# Patient Record
Sex: Male | Born: 1955 | Race: White | Hispanic: No | Smoking: Current every day smoker
Health system: Southern US, Community
[De-identification: ages and names within clinical notes are randomized; demographics above are authoritative.]

## PROBLEM LIST (undated history)

## (undated) DIAGNOSIS — J209 Acute bronchitis, unspecified: Secondary | ICD-10-CM

## (undated) DIAGNOSIS — R062 Wheezing: Secondary | ICD-10-CM

## (undated) DIAGNOSIS — J45909 Unspecified asthma, uncomplicated: Secondary | ICD-10-CM

## (undated) DIAGNOSIS — F341 Dysthymic disorder: Secondary | ICD-10-CM

## (undated) DIAGNOSIS — J309 Allergic rhinitis, unspecified: Secondary | ICD-10-CM

## (undated) DIAGNOSIS — H1030 Unspecified acute conjunctivitis, unspecified eye: Secondary | ICD-10-CM

## (undated) DIAGNOSIS — M7020 Olecranon bursitis, unspecified elbow: Secondary | ICD-10-CM

## (undated) DIAGNOSIS — F528 Other sexual dysfunction not due to a substance or known physiological condition: Secondary | ICD-10-CM

## (undated) DIAGNOSIS — E785 Hyperlipidemia, unspecified: Secondary | ICD-10-CM

## (undated) DIAGNOSIS — H9190 Unspecified hearing loss, unspecified ear: Secondary | ICD-10-CM

## (undated) DIAGNOSIS — J019 Acute sinusitis, unspecified: Secondary | ICD-10-CM

## (undated) HISTORY — DX: Acute bronchitis, unspecified: J20.9

## (undated) HISTORY — DX: Allergic rhinitis, unspecified: J30.9

## (undated) HISTORY — DX: Hyperlipidemia, unspecified: E78.5

## (undated) HISTORY — DX: Unspecified acute conjunctivitis, unspecified eye: H10.30

## (undated) HISTORY — DX: Unspecified hearing loss, unspecified ear: H91.90

## (undated) HISTORY — PX: WISDOM TOOTH EXTRACTION: SHX21

## (undated) HISTORY — DX: Olecranon bursitis, unspecified elbow: M70.20

## (undated) HISTORY — DX: Wheezing: R06.2

## (undated) HISTORY — DX: Unspecified asthma, uncomplicated: J45.909

## (undated) HISTORY — DX: Dysthymic disorder: F34.1

## (undated) HISTORY — DX: Other sexual dysfunction not due to a substance or known physiological condition: F52.8

## (undated) HISTORY — DX: Acute sinusitis, unspecified: J01.90

---

## 2005-04-24 ENCOUNTER — Ambulatory Visit: Payer: Self-pay | Admitting: Internal Medicine

## 2005-09-04 ENCOUNTER — Ambulatory Visit: Payer: Self-pay | Admitting: Internal Medicine

## 2006-05-05 ENCOUNTER — Ambulatory Visit: Payer: Self-pay | Admitting: Internal Medicine

## 2007-09-17 ENCOUNTER — Encounter: Payer: Self-pay | Admitting: Internal Medicine

## 2007-09-17 ENCOUNTER — Ambulatory Visit: Payer: Self-pay | Admitting: Internal Medicine

## 2007-09-17 DIAGNOSIS — J209 Acute bronchitis, unspecified: Secondary | ICD-10-CM

## 2007-09-17 DIAGNOSIS — F528 Other sexual dysfunction not due to a substance or known physiological condition: Secondary | ICD-10-CM

## 2007-09-17 DIAGNOSIS — J45909 Unspecified asthma, uncomplicated: Secondary | ICD-10-CM

## 2007-09-17 HISTORY — DX: Acute bronchitis, unspecified: J20.9

## 2007-09-17 HISTORY — DX: Unspecified asthma, uncomplicated: J45.909

## 2007-09-17 HISTORY — DX: Other sexual dysfunction not due to a substance or known physiological condition: F52.8

## 2007-09-23 ENCOUNTER — Telehealth (INDEPENDENT_AMBULATORY_CARE_PROVIDER_SITE_OTHER): Payer: Self-pay | Admitting: *Deleted

## 2007-09-24 LAB — CONVERTED CEMR LAB
ALT: 15 U/L
AST: 14 U/L
Albumin: 3.7 g/dL
Alkaline Phosphatase: 70 U/L
BUN: 11 mg/dL
Bacteria, UA: NEGATIVE
Basophils Absolute: 0.4 K/uL — ABNORMAL HIGH
Basophils Relative: 4.9 % — ABNORMAL HIGH
Bilirubin, Direct: 0.2 mg/dL
CO2: 30 meq/L
Calcium: 9.4 mg/dL
Chloride: 104 meq/L
Cholesterol: 182 mg/dL
Creatinine, Ser: 1.2 mg/dL
Crystals: NEGATIVE
Eosinophils Absolute: 0.1 K/uL
Eosinophils Relative: 1.2 %
GFR calc Af Amer: 83 mL/min
GFR calc non Af Amer: 69 mL/min
Glucose, Bld: 95 mg/dL
HCT: 48.2 %
HDL: 31.5 mg/dL — ABNORMAL LOW
Hemoglobin: 17 g/dL
Ketones, ur: 15 mg/dL — AB
LDL Cholesterol: 136 mg/dL — ABNORMAL HIGH
Leukocytes, UA: NEGATIVE
Lymphocytes Relative: 14.6 %
MCHC: 35.3 g/dL
MCV: 83.9 fL
Monocytes Absolute: 0.8 K/uL — ABNORMAL HIGH
Monocytes Relative: 9 %
Neutro Abs: 6.1 K/uL
Neutrophils Relative %: 70.3 %
Nitrite: NEGATIVE
PSA: 1.83 ng/mL
Platelets: 178 K/uL
Potassium: 4.9 meq/L
RBC: 5.74 M/uL
RDW: 13.4 %
Sodium: 137 meq/L
Specific Gravity, Urine: >=1.03
Squamous Epithelial / HPF: NEGATIVE /LPF
TSH: 1.22 u[IU]/mL
Total Bilirubin: 0.9 mg/dL
Total CHOL/HDL Ratio: 5.8
Total Protein, Urine: NEGATIVE mg/dL
Total Protein: 7 g/dL
Triglycerides: 71 mg/dL
Urine Glucose: NEGATIVE mg/dL
Urobilinogen, UA: 0.2
VLDL: 14 mg/dL
WBC: 8.7 10*3/microliter
pH: 5.5

## 2008-08-17 ENCOUNTER — Ambulatory Visit: Payer: Self-pay | Admitting: Internal Medicine

## 2009-02-07 ENCOUNTER — Ambulatory Visit: Payer: Self-pay | Admitting: Internal Medicine

## 2009-02-07 DIAGNOSIS — H9190 Unspecified hearing loss, unspecified ear: Secondary | ICD-10-CM | POA: Insufficient documentation

## 2009-02-07 DIAGNOSIS — J019 Acute sinusitis, unspecified: Secondary | ICD-10-CM | POA: Insufficient documentation

## 2009-02-07 HISTORY — DX: Acute sinusitis, unspecified: J01.90

## 2009-02-07 HISTORY — DX: Unspecified hearing loss, unspecified ear: H91.90

## 2010-01-04 ENCOUNTER — Ambulatory Visit: Payer: Self-pay | Admitting: Internal Medicine

## 2010-01-04 DIAGNOSIS — R062 Wheezing: Secondary | ICD-10-CM | POA: Insufficient documentation

## 2010-01-04 HISTORY — DX: Wheezing: R06.2

## 2010-03-17 ENCOUNTER — Emergency Department (HOSPITAL_COMMUNITY): Admission: EM | Admit: 2010-03-17 | Discharge: 2010-03-17 | Payer: Self-pay | Admitting: Family Medicine

## 2010-05-10 ENCOUNTER — Ambulatory Visit: Payer: Self-pay | Admitting: Internal Medicine

## 2010-05-10 DIAGNOSIS — H1030 Unspecified acute conjunctivitis, unspecified eye: Secondary | ICD-10-CM

## 2010-05-10 DIAGNOSIS — J309 Allergic rhinitis, unspecified: Secondary | ICD-10-CM

## 2010-05-10 HISTORY — DX: Unspecified acute conjunctivitis, unspecified eye: H10.30

## 2010-05-10 HISTORY — DX: Allergic rhinitis, unspecified: J30.9

## 2010-06-27 ENCOUNTER — Encounter: Admission: RE | Admit: 2010-06-27 | Discharge: 2010-06-27 | Payer: Self-pay | Admitting: Orthopedic Surgery

## 2010-08-08 ENCOUNTER — Ambulatory Visit: Payer: Self-pay | Admitting: Internal Medicine

## 2010-08-08 DIAGNOSIS — F341 Dysthymic disorder: Secondary | ICD-10-CM | POA: Insufficient documentation

## 2010-08-08 DIAGNOSIS — M7020 Olecranon bursitis, unspecified elbow: Secondary | ICD-10-CM | POA: Insufficient documentation

## 2010-08-08 DIAGNOSIS — E785 Hyperlipidemia, unspecified: Secondary | ICD-10-CM | POA: Insufficient documentation

## 2010-08-08 DIAGNOSIS — E782 Mixed hyperlipidemia: Secondary | ICD-10-CM | POA: Insufficient documentation

## 2010-08-08 HISTORY — DX: Dysthymic disorder: F34.1

## 2010-08-08 HISTORY — DX: Hyperlipidemia, unspecified: E78.5

## 2010-08-08 HISTORY — DX: Olecranon bursitis, unspecified elbow: M70.20

## 2010-08-08 LAB — CONVERTED CEMR LAB
Alkaline Phosphatase: 75 units/L (ref 39–117)
BUN: 13 mg/dL (ref 6–23)
Basophils Absolute: 0.1 10*3/uL (ref 0.0–0.1)
Basophils Relative: 1.1 % (ref 0.0–3.0)
Calcium: 9.5 mg/dL (ref 8.4–10.5)
GFR calc non Af Amer: 80.69 mL/min (ref >60)
Glucose, Bld: 79 mg/dL (ref 70–99)
HDL: 39.8 mg/dL (ref >39.00)
Hemoglobin: 16.9 g/dL (ref 13.0–17.0)
Monocytes Absolute: 0.4 10*3/uL (ref 0.1–1.0)
Monocytes Relative: 4.6 % (ref 3.0–12.0)
Neutro Abs: 7.1 10*3/uL (ref 1.4–7.7)
Nitrite: NEGATIVE
PSA: 2 ng/mL (ref 0.10–4.00)
Platelets: 229 10*3/uL (ref 150.0–400.0)
RDW: 14.7 % — ABNORMAL HIGH (ref 11.5–14.6)
Sodium: 138 meq/L (ref 135–145)
Specific Gravity, Urine: 1.02 (ref 1.000–1.030)
Total Bilirubin: 0.9 mg/dL (ref 0.3–1.2)
Total CHOL/HDL Ratio: 5
Total Protein, Urine: NEGATIVE mg/dL
Total Protein: 7.1 g/dL (ref 6.0–8.3)
Triglycerides: 67 mg/dL (ref 0.0–149.0)
WBC: 9.4 10*3/uL (ref 4.5–10.5)

## 2010-11-20 ENCOUNTER — Ambulatory Visit
Admission: RE | Admit: 2010-11-20 | Discharge: 2010-11-20 | Payer: Self-pay | Source: Home / Self Care | Attending: Internal Medicine | Admitting: Internal Medicine

## 2010-12-24 NOTE — Assessment & Plan Note (Signed)
Summary: COUGH-EAR PRESSURE W/POPPING-SINUS PROBLEM STC   Vital Signs:  Patient profile:   55 year old male Height:      71 inches (180.34 cm) Weight:      205.8 pounds (93.55 kg) O2 Sat:      97 % on Room air Temp:     98.2 degrees F (36.78 degrees C) oral Pulse rate:   88 / minute BP sitting:   112 / 68  (left arm) Cuff size:   regular  Vitals Entered By: Orlan Leavens (May 10, 2010 4:29 PM)  O2 Flow:  Room air  CC: Cough/ (L) ear pressure Is Patient Diabetic? No Pain Assessment Patient in pain? no        CC:  Cough/ (L) ear pressure.  History of Present Illness: here with acute onset mild to mod x 3 days gradually worsening faicial pain, pressure, fever , greenish d/c with slight ST and nonprod cough;  also today with onset bilat conjunctival d/c , not sure of color, but not assoc wtih vision problem, hearing loss or earache or headache, dizziness;  has had nasal allergy symptoms for several weeks as well with some popping to the left ear.  Pt denies CP, sob, doe, wheezing, orthopnea, pnd, worsening LE edema, palps, dizziness or syncope   Pt denies new neuro symptoms such as headache, facial or extremity weakness   Mucines OTC not helping.    Problems Prior to Update: 1)  Wheezing  (ICD-786.07) 2)  Unspecified Hearing Loss  (ICD-389.9) 3)  Sinusitis- Acute-nos  (ICD-461.9) 4)  Bronchitis, Acute With Bronchospasm  (ICD-466.0) 5)  Preventive Health Care  (ICD-V70.0) 6)  Erectile Dysfunction  (ICD-302.72) 7)  Asthma, Childhood  (ICD-493.00) 8)  Asthmatic Bronchitis, Acute  (ICD-466.0) 9)  Family History Diabetes 1st Degree Relative  (ICD-V18.0)  Medications Prior to Update: 1)  Clarithromycin 500 Mg Tabs (Clarithromycin) .Marland Kitchen.. 1 By Mouth Two Times A Day 2)  Promethazine-Codeine 6.25-10 Mg/29ml Syrp (Promethazine-Codeine) .Marland Kitchen.. 1 Tsp By Mouth Q 6 Hrs As Needed Cough 3)  Cialis 20 Mg Tabs (Tadalafil) .Marland Kitchen.. 1 By Mouth Once Daily As Needed 4)  Prednisone 10 Mg Tabs (Prednisone)  .... 3po Qd For 3days, Then 2po Qd For 3days, Then 1po Qd For 3days, Then Stop  Current Medications (verified): 1)  Promethazine-Codeine 6.25-10 Mg/15ml Syrp (Promethazine-Codeine) .Marland Kitchen.. 1 Tsp By Mouth Q 6 Hrs As Needed Cough 2)  Cialis 20 Mg Tabs (Tadalafil) .Marland Kitchen.. 1 By Mouth Once Daily As Needed 3)  Cephalexin 500 Mg Caps (Cephalexin) .Marland Kitchen.. 1 By Mouth Three Times A Day 4)  Tobramycin Sulfate 0.3 % Soln (Tobramycin Sulfate) .... 2 Gtts Both Eyes Qid For 10 Days  Allergies (verified): No Known Drug Allergies  Past History:  Past Surgical History: Last updated: 09/17/2007 none  Social History: Last updated: 09/17/2007 Current Smoker Alcohol use-yes  Risk Factors: Smoking Status: current (09/17/2007)  Past Medical History: childhood asthma E.D. Allergic rhinitis  Review of Systems       all otherwise negative per pt -    Physical Exam  General:  alert and overweight-appearing. , mild ill   Head:  normocephalic and atraumatic.   Eyes:  vision grossly intact, pupils equal, and pupils round., bilat conjunctival erythema and slight d/c   Ears:  bilat tm's red, sinus tender bialt Nose:  nasal dischargemucosal pallor and mucosal edema.   Mouth:  pharyngeal erythema and fair dentition.   Neck:  supple and cervical lymphadenopathy.   Lungs:  normal respiratory effort and  normal breath sounds.   Heart:  normal rate and regular rhythm.   Extremities:  no edema, no erythema    Impression & Recommendations:  Problem # 1:  SINUSITIS- ACUTE-NOS (ICD-461.9)  The following medications were removed from the medication list:    Clarithromycin 500 Mg Tabs (Clarithromycin) .Marland Kitchen... 1 by mouth two times a day His updated medication list for this problem includes:    Promethazine-codeine 6.25-10 Mg/66ml Syrp (Promethazine-codeine) .Marland Kitchen... 1 tsp by mouth q 6 hrs as needed cough    Cephalexin 500 Mg Caps (Cephalexin) .Marland Kitchen... 1 by mouth three times a day treat as above, f/u any worsening signs or  symptoms   Problem # 2:  CONJUNCTIVITIS, ACUTE, BILATERAL (ICD-372.00)  His updated medication list for this problem includes:    Tobramycin Sulfate 0.3 % Soln (Tobramycin sulfate) .Marland Kitchen... 2 gtts both eyes qid for 10 days treat as above, f/u any worsening signs or symptoms   Problem # 3:  ALLERGIC RHINITIS (ICD-477.9) advised to take OTC allegra  Complete Medication List: 1)  Promethazine-codeine 6.25-10 Mg/59ml Syrp (Promethazine-codeine) .Marland Kitchen.. 1 tsp by mouth q 6 hrs as needed cough 2)  Cialis 20 Mg Tabs (Tadalafil) .Marland Kitchen.. 1 by mouth once daily as needed 3)  Cephalexin 500 Mg Caps (Cephalexin) .Marland Kitchen.. 1 by mouth three times a day 4)  Tobramycin Sulfate 0.3 % Soln (Tobramycin sulfate) .... 2 gtts both eyes qid for 10 days  Patient Instructions: 1)  Please take all new medications as prescribed  2)  Continue all previous medications as before this visit  3)  you can also use allegra as needed  4)  Please schedule a follow-up appointment in 3 months with CPX labs Prescriptions: TOBRAMYCIN SULFATE 0.3 % SOLN (TOBRAMYCIN SULFATE) 2 gtts both eyes qid for 10 days  #1 x 0   Entered and Authorized by:   Corwin Levins MD   Signed by:   Corwin Levins MD on 05/10/2010   Method used:   Print then Give to Patient   RxID:   [id number] CEPHALEXIN 500 MG CAPS (CEPHALEXIN) 1 by mouth three times a day  #30 x 0   Entered and Authorized by:   Corwin Levins MD   Signed by:   Corwin Levins MD on 05/10/2010   Method used:   Print then Give to Patient   RxID:   [id number] PROMETHAZINE-CODEINE 6.25-10 MG/5ML SYRP (PROMETHAZINE-CODEINE) 1 tsp by mouth q 6 hrs as needed cough  #6oz x 1   Entered and Authorized by:   Corwin Levins MD   Signed by:   Corwin Levins MD on 05/10/2010   Method used:   Print then Give to Patient   RxID:   [id number]

## 2010-12-24 NOTE — Assessment & Plan Note (Signed)
Summary: painful elbow/cd   Vital Signs:  Patient profile:   55 year old male Height:      72 inches Weight:      198.50 pounds BMI:     27.02 O2 Sat:      97 % on Room air Temp:     98.6 degrees F oral Pulse rate:   88 / minute BP sitting:   144 / 100  (left arm) Cuff size:   regular  Vitals Entered By: Zella Ball Ewing CMA (AAMA) (August 08, 2010 11:22 AM)  O2 Flow:  Room air  Preventive Care Screening     declines colonoscopy/tetanus at this time  CC: Left elbow swollen, numb, discuss depression/RE   CC:  Left elbow swollen, numb, and discuss depression/RE.  History of Present Illness: here to f/u adn wellness;  has c/o swollen are to the tip of the left elbow for 2 wks, slightly better but persists though little to no pain and was wondering since her read on the internet if it needed to be drained.  Also incidently with several weeks increased social stressors, does not want counseling but wonders also med tx such as SSRI.  Denies suicidal ideaiton, but has had icnreased fatigue, sleeping less, low mood and lack of energy adn anhedonia, also with increased marked anxiety and one recent panic episode, but did not go to ER.   Preventive Screening-Counseling & Management      Drug Use:  no.    Problems Prior to Update: 1)  Olecranon Bursitis, Left  (ICD-726.33) 2)  Hyperlipidemia  (ICD-272.4) 3)  Anxiety Depression  (ICD-300.4) 4)  Allergic Rhinitis  (ICD-477.9) 5)  Conjunctivitis, Acute, Bilateral  (ICD-372.00) 6)  Wheezing  (ICD-786.07) 7)  Unspecified Hearing Loss  (ICD-389.9) 8)  Sinusitis- Acute-nos  (ICD-461.9) 9)  Bronchitis, Acute With Bronchospasm  (ICD-466.0) 10)  Preventive Health Care  (ICD-V70.0) 11)  Erectile Dysfunction  (ICD-302.72) 12)  Asthma, Childhood  (ICD-493.00) 13)  Asthmatic Bronchitis, Acute  (ICD-466.0) 14)  Family History Diabetes 1st Degree Relative  (ICD-V18.0)  Medications Prior to Update: 1)  Promethazine-Codeine 6.25-10 Mg/42ml Syrp  (Promethazine-Codeine) .Marland Kitchen.. 1 Tsp By Mouth Q 6 Hrs As Needed Cough 2)  Cialis 20 Mg Tabs (Tadalafil) .Marland Kitchen.. 1 By Mouth Once Daily As Needed 3)  Cephalexin 500 Mg Caps (Cephalexin) .Marland Kitchen.. 1 By Mouth Three Times A Day 4)  Tobramycin Sulfate 0.3 % Soln (Tobramycin Sulfate) .... 2 Gtts Both Eyes Qid For 10 Days  Current Medications (verified): 1)  Cialis 20 Mg Tabs (Tadalafil) .Marland Kitchen.. 1 By Mouth Once Daily As Needed 2)  Alprazolam 0.5 Mg Tabs (Alprazolam) .Marland Kitchen.. 1by Mouth Two Times A Day As Needed 3)  Citalopram Hydrobromide 20 Mg Tabs (Citalopram Hydrobromide) .Marland Kitchen.. 1po Once Daily 4)  Aspir-Low 81 Mg Tbec (Aspirin) .Marland Kitchen.. 1 By Mouth Once Daily  Allergies (verified): No Known Drug Allergies  Past History:  Past Surgical History: Last updated: 09/17/2007 none  Family History: Last updated: 09/17/2007 Family History Diabetes 1st degree relative Family History High cholesterol Family History Hypertension  Social History: Last updated: 08/08/2010 Current Smoker Alcohol use-yes Drug use-no Married  Risk Factors: Smoking Status: current (09/17/2007)  Past Medical History: childhood asthma E.D. Allergic rhinitis Hyperlipidemia  Social History: Reviewed history from 09/17/2007 and no changes required. Current Smoker Alcohol use-yes Drug use-no Married Drug Use:  no  Review of Systems  The patient denies anorexia, fever, vision loss, decreased hearing, hoarseness, chest pain, syncope, dyspnea on exertion, peripheral edema, prolonged cough, headaches,  hemoptysis, abdominal pain, melena, hematochezia, severe indigestion/heartburn, hematuria, muscle weakness, suspicious skin lesions, transient blindness, difficulty walking, depression, unusual weight change, abnormal bleeding, enlarged lymph nodes, and angioedema.         all otherwise negative per pt -    Physical Exam  General:  alert and overweight-appearing.  Head:  normocephalic and atraumatic.   Eyes:  vision grossly intact,  pupils equal, and pupils round., Ears:  R ear normal and L ear normal.   Nose:  no external deformity and no nasal discharge.   Mouth:  no gingival abnormalities and pharynx pink and moist.   Neck:  supple and no masses.   Lungs:  normal respiratory effort and normal breath sounds.   Heart:  normal rate and regular rhythm.   Abdomen:  soft, non-tender, and normal bowel sounds.   Msk:  no joint tenderness and no joint swelling.except for 1-2+ left elbow bursa sweling with minor tender only, no erythema or drainage   Extremities:  no edema, no erythema  Neurologic:  cranial nerves II-XII intact and strength normal in all extremities.   Skin:  color normal and no rashes.   Psych:  depressed affect and moderately anxious.     Impression & Recommendations:  Problem # 1:  Preventive Health Care (ICD-V70.0)  Overall doing well, age appropriate education and counseling updated and referral for appropriate preventive services done unless declined, immunizations up to date or declined, diet counseling done if overweight, urged to quit smoking if smokes , most recent labs reviewed and current ordered if appropriate, ecg reviewed or declined (interpretation per ECG scanned in the EMR if done); information regarding Medicare Prevention requirements given if appropriate; speciality referrals updated as appropriate   Orders: TLB-BMP (Basic Metabolic Panel-BMET) (80048-METABOL) TLB-CBC Platelet - w/Differential (85025-CBCD) TLB-Hepatic/Liver Function Pnl (80076-HEPATIC) TLB-Lipid Panel (80061-LIPID) TLB-TSH (Thyroid Stimulating Hormone) (84443-TSH) TLB-PSA (Prostate Specific Antigen) (84153-PSA) TLB-Udip ONLY (81003-UDIP)  Problem # 2:  ANXIETY DEPRESSION (ICD-300.4) situational - tx for now with citalopram/alprazolam asd, f/u any worsening symptoms, declines counseling at this time  Problem # 3:  OLECRANON BURSITIS, LEFT (ICD-726.33) mild, d/w pt - no tx needed at this time, should continue to  resolve in the next few wks, to avoid trauma or overuse  Complete Medication List: 1)  Cialis 20 Mg Tabs (Tadalafil) .Marland Kitchen.. 1 by mouth once daily as needed 2)  Alprazolam 0.5 Mg Tabs (Alprazolam) .Marland Kitchen.. 1by mouth two times a day as needed 3)  Citalopram Hydrobromide 20 Mg Tabs (Citalopram hydrobromide) .Marland Kitchen.. 1po once daily 4)  Aspir-low 81 Mg Tbec (Aspirin) .Marland Kitchen.. 1 by mouth once daily  Other Orders: Admin 1st Vaccine (16109) Flu Vaccine 70yrs + [phone])  Patient Instructions: 1)  Please take all new medications as prescribed  - the alprazolam is as needed only, and the citalopram should be started at HALF per day for 3 days to avoid nausea, then one per day after that 2)  Take an Aspirin every day - 81 mg - 1 per day - COATED only 3)  Please go to the Lab in the basement for your blood and/or urine tests today 4)  Please call the number on the [facility] Card for results of your testing  5)  We will also try to mail your results this time 6)  Continue all other previous medications as before this visit  7)  Please schedule a follow-up appointment in 1 year, or as you are able 8)  Please call for refills as you might need  Prescriptions: CITALOPRAM HYDROBROMIDE 20 MG TABS (CITALOPRAM HYDROBROMIDE) 1po once daily  #90 x 3   Entered and Authorized by:   Corwin Levins MD   Signed by:   Corwin Levins MD on 08/08/2010   Method used:   Print then Give to Patient   RxID:   [id number] ALPRAZOLAM 0.5 MG TABS (ALPRAZOLAM) 1by mouth two times a day as needed  #60 x 2   Entered and Authorized by:   Corwin Levins MD   Signed by:   Corwin Levins MD on 08/08/2010   Method used:   Print then Give to Patient   RxID:   [id number]    Flu Vaccine Consent Questions     Do you have a history of severe allergic reactions to this vaccine? no    Any prior history of allergic reactions to egg and/or gelatin? no    Do you have a sensitivity to the preservative Thimersol? no    Do you have a past history of  Guillan-Barre Syndrome? no    Do you currently have an acute febrile illness? no    Have you ever had a severe reaction to latex? no    Vaccine information given and explained to patient? yes    Are you currently pregnant? no    Lot Number:[id number]   Exp Date:05/24/2011   Site Given  Left Deltoid IMflu

## 2010-12-24 NOTE — Assessment & Plan Note (Signed)
Summary: cough fever chills--stc   Vital Signs:  Patient profile:   55 year old male Height:      71 inches Weight:      210 pounds BMI:     29.39 O2 Sat:      97 % on Room air Temp:     99.4 degrees F oral Pulse rate:   87 / minute BP sitting:   122 / 70  (left arm) Cuff size:   large  Vitals Entered ByZella Ball Ewing (January 04, 2010 3:09 PM)  O2 Flow:  Room air CC: cough, fever, chest congestion SOB/RE   CC:  cough, fever, and chest congestion SOB/RE.  History of Present Illness: here with 2 to 3 days onset headache, malaise, general weaknes, low grade temp without chills, mild ST, minimal prod cough but not sure color of sputum, no blood, but also with mild wheezing and hard to take deep breaths.  Pt denies CP, orthopnea, pnd, worsening LE edema, palps, dizziness or syncope .  Pt denies new neuro symptoms such as headache, facial or extremity weakness   Problems Prior to Update: 1)  Wheezing  (ICD-786.07) 2)  Pharyngitis-acute  (ICD-462) 3)  Unspecified Hearing Loss  (ICD-389.9) 4)  Sinusitis- Acute-nos  (ICD-461.9) 5)  Bronchitis, Acute With Bronchospasm  (ICD-466.0) 6)  Preventive Health Care  (ICD-V70.0) 7)  Erectile Dysfunction  (ICD-302.72) 8)  Asthma, Childhood  (ICD-493.00) 9)  Asthmatic Bronchitis, Acute  (ICD-466.0) 10)  Family History Diabetes 1st Degree Relative  (ICD-V18.0)  Medications Prior to Update: 1)  Cephalexin 500 Mg Caps (Cephalexin) .Marland Kitchen.. 1 By Mouth Three Times A Day 2)  Promethazine-Codeine 6.25-10 Mg/60ml Syrp (Promethazine-Codeine) .Marland Kitchen.. 1 Tsp By Mouth Q 6 Hrs As Needed Cough 3)  Cialis 20 Mg Tabs (Tadalafil) .Marland Kitchen.. 1 By Mouth Once Daily As Needed  Current Medications (verified): 1)  Clarithromycin 500 Mg Tabs (Clarithromycin) .Marland Kitchen.. 1 By Mouth Two Times A Day 2)  Promethazine-Codeine 6.25-10 Mg/36ml Syrp (Promethazine-Codeine) .Marland Kitchen.. 1 Tsp By Mouth Q 6 Hrs As Needed Cough 3)  Cialis 20 Mg Tabs (Tadalafil) .Marland Kitchen.. 1 By Mouth Once Daily As Needed 4)   Prednisone 10 Mg Tabs (Prednisone) .... 3po Qd For 3days, Then 2po Qd For 3days, Then 1po Qd For 3days, Then Stop  Allergies (verified): No Known Drug Allergies  Past History:  Past Medical History: Last updated: 09/17/2007 childhood asthma E.D.  Past Surgical History: Last updated: 09/17/2007 none  Social History: Last updated: 09/17/2007 Current Smoker Alcohol use-yes  Risk Factors: Smoking Status: current (09/17/2007)  Review of Systems       all otherwise negative per pt -  Physical Exam  General:  alert and overweight-appearing.  , mild ill  Head:  normocephalic and atraumatic.   Eyes:  vision grossly intact, pupils equal, and pupils round.   Ears:  bilat tm's midl red, sinus nontender Nose:  nasal dischargemucosal pallor and mucosal erythema.   Mouth:  pharyngeal erythema and pharyngeal exudate.   Neck:  supple and cervical lymphadenopathy.   Lungs:  normal respiratory effort, R decreased breath sounds, R wheezes, L decreased breath sounds, and L wheezes.   Heart:  normal rate and regular rhythm.   Extremities:  no edema, no erythema    Impression & Recommendations:  Problem # 1:  PHARYNGITIS-ACUTE (ICD-462)  His updated medication list for this problem includes:    Clarithromycin 500 Mg Tabs (Clarithromycin) .Marland Kitchen... 1 by mouth two times a day treat as above, f/u any worsening signs or  symptoms   Problem # 2:  WHEEZING (ICD-786.07) likely due to above, for prednisone burst adn taper off  Complete Medication List: 1)  Clarithromycin 500 Mg Tabs (Clarithromycin) .Marland Kitchen.. 1 by mouth two times a day 2)  Promethazine-codeine 6.25-10 Mg/36ml Syrp (Promethazine-codeine) .Marland Kitchen.. 1 tsp by mouth q 6 hrs as needed cough 3)  Cialis 20 Mg Tabs (Tadalafil) .Marland Kitchen.. 1 by mouth once daily as needed 4)  Prednisone 10 Mg Tabs (Prednisone) .... 3po qd for 3days, then 2po qd for 3days, then 1po qd for 3days, then stop  Patient Instructions: 1)  Please take all new medications as  prescribed 2)  Continue all previous medications as before this visit  3)  Please schedule a follow-up appointment in 3 months with CPX labs Prescriptions: CIALIS 20 MG TABS (TADALAFIL) 1 by mouth once daily as needed  #5 x 11   Entered and Authorized by:   Corwin Levins MD   Signed by:   Corwin Levins MD on 01/04/2010   Method used:   Print then Give to Patient   RxID:   [id number] PREDNISONE 10 MG TABS (PREDNISONE) 3po qd for 3days, then 2po qd for 3days, then 1po qd for 3days, then stop  #6 x 1   Entered and Authorized by:   Corwin Levins MD   Signed by:   Corwin Levins MD on 01/04/2010   Method used:   Print then Give to Patient   RxID:   [id number] PROMETHAZINE-CODEINE 6.25-10 MG/5ML SYRP (PROMETHAZINE-CODEINE) 1 tsp by mouth q 6 hrs as needed cough  #6 oz x 1   Entered and Authorized by:   Corwin Levins MD   Signed by:   Corwin Levins MD on 01/04/2010   Method used:   Print then Give to Patient   RxID:   [id number] CLARITHROMYCIN 500 MG TABS (CLARITHROMYCIN) 1 by mouth two times a day  #20 x 0   Entered and Authorized by:   Corwin Levins MD   Signed by:   Corwin Levins MD on 01/04/2010   Method used:   Print then Give to Patient   RxID:   [id number]

## 2010-12-26 NOTE — Assessment & Plan Note (Signed)
Summary: CHEST CONGESTION--WHEEZING---STC   Vital Signs:  Patient profile:   55 year old male Height:      72 inches Weight:      203.38 pounds BMI:     27.68 O2 Sat:      93 % on Room air Temp:     99.1 degrees F oral Pulse rate:   92 / minute BP sitting:   130 / 80  (left arm) Cuff size:   large  Vitals Entered By: Zella Ball Ewing CMA [facility] Dull) (November 20, 2010 8:04 AM)  O2 Flow:  Room air CC: Chest Congestion and wheezing/RE   CC:  Chest Congestion and wheezing/RE.  History of Present Illness: here a wk before his inusrance starts with new job with acute visit;  unfort unable so far to quit smoking;  not really trying to follow lower chol diet, but c/o acute onset mild to mod 3 days worsening headache, fever, sinus congestion with colored drainagae, worsening ST, prod cough wtih greenish sputum,  and worsening wheezing with minor sob but denies doe.  Pt denies CP, orthopnea, pnd, worsening LE edema, palps, dizziness or syncope .  Pt denies new neuro symptoms such as facial or extremity weakness .  Pt denies polydipsia, polyuria  Overall good compliance with meds, trying to follow low chol diet, wt stable, little excercise however .  Denies worsening depressive symptoms, suicidal ideation, or panic.  , has stopped his psych meds. No chills  Preventive Screening-Counseling & Management      Drug Use:  no.    Problems Prior to Update: 1)  Wheezing  (ICD-786.07) 2)  Bronchitis-acute  (ICD-466.0) 3)  Olecranon Bursitis, Left  (ICD-726.33) 4)  Hyperlipidemia  (ICD-272.4) 5)  Anxiety Depression  (ICD-300.4) 6)  Allergic Rhinitis  (ICD-477.9) 7)  Conjunctivitis, Acute, Bilateral  (ICD-372.00) 8)  Wheezing  (ICD-786.07) 9)  Unspecified Hearing Loss  (ICD-389.9) 10)  Sinusitis- Acute-nos  (ICD-461.9) 11)  Bronchitis, Acute With Bronchospasm  (ICD-466.0) 12)  Preventive Health Care  (ICD-V70.0) 13)  Erectile Dysfunction  (ICD-302.72) 14)  Asthma, Childhood  (ICD-493.00) 15)  Asthmatic  Bronchitis, Acute  (ICD-466.0) 16)  Family History Diabetes 1st Degree Relative  (ICD-V18.0)  Medications Prior to Update: 1)  Cialis 20 Mg Tabs (Tadalafil) .Marland Kitchen.. 1 By Mouth Once Daily As Needed 2)  Alprazolam 0.5 Mg Tabs (Alprazolam) .Marland Kitchen.. 1by Mouth Two Times A Day As Needed 3)  Citalopram Hydrobromide 20 Mg Tabs (Citalopram Hydrobromide) .Marland Kitchen.. 1po Once Daily 4)  Aspir-Low 81 Mg Tbec (Aspirin) .Marland Kitchen.. 1 By Mouth Once Daily  Current Medications (verified): 1)  Cialis 20 Mg Tabs (Tadalafil) .Marland Kitchen.. 1 By Mouth Once Daily As Needed 2)  Aspir-Low 81 Mg Tbec (Aspirin) .Marland Kitchen.. 1 By Mouth Once Daily 3)  Cephalexin 500 Mg Caps (Cephalexin) .Marland Kitchen.. 1 By Mouth Three Times A Day 4)  Prednisone 10 Mg Tabs (Prednisone) .... 4po Qd For 3days, Then 3po Qd For 3days, Then 2po Qd For 3days, Then 1po Qd For 3 Days, Then Stop 5)  Tessalon Perles 100 Mg Caps (Benzonatate) .Marland Kitchen.. 1po Three Times A Day As Needed  Allergies (verified): No Known Drug Allergies  Past History:  Past Medical History: Last updated: 08/08/2010 childhood asthma E.D. Allergic rhinitis Hyperlipidemia  Past Surgical History: Last updated: 09/17/2007 none  Social History: Last updated: 08/08/2010 Current Smoker Alcohol use-yes Drug use-no Married  Risk Factors: Smoking Status: current (09/17/2007)  Review of Systems       all otherwise negative per pt -  Physical Exam  General:  alert and well-developed.  , miild ill  Head:  normocephalic and atraumatic.   Eyes:  vision grossly intact, pupils equal, and pupils round.   Ears:  R ear normal and L ear normal.   Nose:  nasal dischargemucosal pallor and mucosal edema.   Mouth:  pharyngeal erythema and fair dentition.   Neck:  supple and no masses.   Lungs:  normal respiratory effort, R decreased breath sounds, R wheezes, L decreased breath sounds, and L wheezes.   Heart:  normal rate and regular rhythm.   Extremities:  no edema, no erythema  Psych:  not depressed appearing and  slightly anxious.     Impression & Recommendations:  Problem # 1:  BRONCHITIS-ACUTE (ICD-466.0)  His updated medication list for this problem includes:    Cephalexin 500 Mg Caps (Cephalexin) .Marland Kitchen... 1 by mouth three times a day    Tessalon Perles 100 Mg Caps (Benzonatate) .Marland Kitchen... 1po three times a day as needed treat as above, f/u any worsening signs or symptoms   Problem # 2:  WHEEZING (ICD-786.07)  mild to mod, likely due to above,  for depomedrol Im today, and pred pack for home, suspect underlying copd;  urged pt to quit smoking  Orders: Depo- Medrol 40mg  (J1030) Depo- Medrol 80mg  (J1040) Admin of Therapeutic Inj  intramuscular or subcutaneous (16109)  Problem # 3:  HYPERLIPIDEMIA (ICD-272.4)  Labs Reviewed: SGOT: 16 (08/08/2010)   SGPT: 14 (08/08/2010)   HDL:39.80 (08/08/2010), 31.5 (09/17/2007)  LDL:131 (08/08/2010), 136 (09/17/2007)  Chol:184 (08/08/2010), 182 (09/17/2007)  Trig:67.0 (08/08/2010), 71 (09/17/2007) d/w pt - to cont diet, Pt to continue diet efforts, declines statin or other today- goal LDL less than   Problem # 4:  ANXIETY DEPRESSION (ICD-300.4) stable overall by hx and exam, ok to continue meds/tx as is - currently off meds, no insurance at this time, has new job, declines counseling or further meds  Complete Medication List: 1)  Cialis 20 Mg Tabs (Tadalafil) .Marland Kitchen.. 1 by mouth once daily as needed 2)  Aspir-low 81 Mg Tbec (Aspirin) .Marland Kitchen.. 1 by mouth once daily 3)  Cephalexin 500 Mg Caps (Cephalexin) .Marland Kitchen.. 1 by mouth three times a day 4)  Prednisone 10 Mg Tabs (Prednisone) .... 4po qd for 3days, then 3po qd for 3days, then 2po qd for 3days, then 1po qd for 3 days, then stop 5)  Tessalon Perles 100 Mg Caps (Benzonatate) .Marland Kitchen.. 1po three times a day as needed  Patient Instructions: 1)  You had the steroid shot today 2)  Please take all new medications as prescribed 3)  Continue all previous medications as before this visit  4)  You can also use Mucinex OTC or it's  generic for congestion , and Delsym also for cough 5)  Please stop smoking 6)  Please follow lower cholesterol diet 7)  Please schedule a follow-up appointment as needed. Prescriptions: TESSALON PERLES 100 MG CAPS (BENZONATATE) 1po three times a day as needed  #60 x 1   Entered and Authorized by:   Corwin Levins MD   Signed by:   Corwin Levins MD on 11/20/2010   Method used:   Print then Give to Patient   RxID:   [id number] PREDNISONE 10 MG TABS (PREDNISONE) 4po qd for 3days, then 3po qd for 3days, then 2po qd for 3days, then 1po qd for 3 days, then stop  #30 x 0   Entered and Authorized by:   Corwin Levins MD  Signed by:   Corwin Levins MD on 11/20/2010   Method used:   Print then Give to Patient   RxID:   [id number] CEPHALEXIN 500 MG CAPS (CEPHALEXIN) 1 by mouth three times a day  #30 x 0   Entered and Authorized by:   Corwin Levins MD   Signed by:   Corwin Levins MD on 11/20/2010   Method used:   Print then Give to Patient   RxID:   [id number]    Medication Administration  Injection # 1:    Medication: Depo- Medrol 40mg     Diagnosis: WHEEZING (ICD-786.07)    Route: IM    Site: RUOQ gluteus    Exp Date: 05/2013    Lot #: [id number]    Mfr: [employer]    Comments: patient received 120mg  Depo-Medrol    Patient tolerated injection without complications    Given by: Zella Ball Ewing CMA [facility]) (November 20, 2010 8:43 AM)  Injection # 2:    Medication: Depo- Medrol 80mg     Diagnosis: WHEEZING (ICD-786.07)    Route: IM    Site: RUOQ gluteus    Exp Date: 05/2013    Lot #: [id number]    Mfr: [employer]    Given by: Zella Ball Ewing CMA [facility] Dull) (November 20, 2010 8:43 AM)  Orders Added: 1)  Depo- Medrol 40mg  [J1030] 2)  Depo- Medrol 80mg  [J1040] 3)  Admin of Therapeutic Inj  intramuscular or subcutaneous [96372] 4)  Est. Patient Level IV [59563]

## 2011-03-27 ENCOUNTER — Ambulatory Visit (INDEPENDENT_AMBULATORY_CARE_PROVIDER_SITE_OTHER): Payer: BC Managed Care – PPO | Admitting: Internal Medicine

## 2011-03-27 ENCOUNTER — Encounter: Payer: Self-pay | Admitting: Internal Medicine

## 2011-03-27 VITALS — BP 112/72 | HR 76 | Temp 98.1°F | Ht 72.0 in | Wt 205.2 lb

## 2011-03-27 DIAGNOSIS — Z0000 Encounter for general adult medical examination without abnormal findings: Secondary | ICD-10-CM

## 2011-03-27 DIAGNOSIS — Z131 Encounter for screening for diabetes mellitus: Secondary | ICD-10-CM | POA: Insufficient documentation

## 2011-03-27 DIAGNOSIS — F341 Dysthymic disorder: Secondary | ICD-10-CM

## 2011-03-27 DIAGNOSIS — J209 Acute bronchitis, unspecified: Secondary | ICD-10-CM

## 2011-03-27 DIAGNOSIS — R062 Wheezing: Secondary | ICD-10-CM | POA: Insufficient documentation

## 2011-03-27 MED ORDER — LEVOFLOXACIN 500 MG PO TABS: 500.0000 mg | ORAL_TABLET | Freq: Every day | ORAL | Status: AC

## 2011-03-27 MED ORDER — PREDNISONE 10 MG PO TABS: 10.0000 mg | ORAL_TABLET | Freq: Every day | ORAL | Status: AC

## 2011-03-27 MED ORDER — HYDROCODONE-HOMATROPINE 5-1.5 MG/5ML PO SYRP: 5.0000 mL | ORAL_SOLUTION | Freq: Four times a day (QID) | ORAL | Status: AC | PRN

## 2011-03-27 MED ORDER — METHYLPREDNISOLONE ACETATE 80 MG/ML IJ SUSP
120.0000 mg | Freq: Once | INTRAMUSCULAR | Status: AC
  Administered 2011-03-27: 120 mg via INTRAMUSCULAR

## 2011-03-27 NOTE — Assessment & Plan Note (Signed)
stable overall by hx and exam, most recent lab reviewed with pt, and pt to continue medical treatment as before,  to f/u any worsening symptoms or concerns,  Declines tx such as ssri or counseling

## 2011-03-27 NOTE — Progress Notes (Signed)
  Subjective:    Patient ID: Leslie Harrington, male    DOB: [birth year], 55 y.o.   MRN: [id number]  HPI Here with acute onset mild to mod 2-3 days ST, HA, general weakness and malaise, with prod cough greenish sputum, but Pt denies chest pain,  orthopnea, PND, increased LE swelling, palpitations, dizziness or syncopem but has had mild wheezing, sob and doem better with leftover albuterol inhaler at home , still works well, does not need refill.  Pt denies new neurological symptoms such as new headache, or facial or extremity weakness or numbness   Pt denies polydipsia, polyuria,  Pt states overall good compliance with meds.  Denies worsening depressive symptoms, suicidal ideation, or panic, though has ongoing anxiety, not increased recently. Past Medical History  Diagnosis Date  . HYPERLIPIDEMIA 08/08/2010  . ANXIETY DEPRESSION 08/08/2010  . ERECTILE DYSFUNCTION 09/17/2007  . CONJUNCTIVITIS, ACUTE, BILATERAL 05/10/2010  . Unspecified hearing loss 02/07/2009  . SINUSITIS- ACUTE-NOS 02/07/2009  . Acute bronchitis 09/17/2007  . ALLERGIC RHINITIS 05/10/2010  . ASTHMA, CHILDHOOD 09/17/2007  . OLECRANON BURSITIS, LEFT 08/08/2010  . Wheezing 01/04/2010   No past surgical history on file.  reports that he has been smoking.  He does not have any smokeless tobacco history on file. He reports that he drinks alcohol. He reports that he does not use illicit drugs. family history includes Diabetes in his other; Hyperlipidemia in his other; and Hypertension in his other. No Known Allergies No current outpatient prescriptions on file prior to visit.   No current facility-administered medications on file prior to visit.   Review of Systems All otherwise neg per pt     Objective:   Physical Exam BP 112/72  Pulse 76  Temp(Src) 98.1 F (36.7 C) (Oral)  Ht 6' (1.829 m)  Wt 205 lb 4 oz (93.101 kg)  BMI 27.84 kg/m2  SpO2 97% Physical Exam  VS noted, mild ill Constitutional: Pt appears well-developed and  well-nourished.  HENT: Head: Normocephalic.  Right Ear: External ear normal.  Left Ear: External ear normal.  Eyes: Conjunctivae and EOM are normal. Pupils are equal, round, and reactive to light.  Neck: Normal range of motion. Neck supple.  Cardiovascular: Normal rate and regular rhythm.   Pulmonary/Chest: Effort normal and breath sounds decreased with bilat wheezing Abd:  Soft, NT, non-distended, + BS Neurological: Pt is alert. No cranial nerve deficit.  Skin: Skin is warm. No erythema.  Psychiatric: Pt behavior is normal. Thought content normal. 1+ nervous        Assessment & Plan:

## 2011-03-27 NOTE — Assessment & Plan Note (Signed)
Mild to mod, for depomedrol IM today, and predpack for home,  to f/u any worsening symptoms or concerns

## 2011-03-27 NOTE — Assessment & Plan Note (Signed)
Mild to mod, for antibx course, and cough med,  to f/u any worsening symptoms or concerns

## 2011-03-27 NOTE — Patient Instructions (Signed)
You had the steroid shot today Take all new medications as prescribed Continue all other medications as before Please return in 6 mo with Lab testing done 3-5 days before

## 2011-04-18 ENCOUNTER — Ambulatory Visit (INDEPENDENT_AMBULATORY_CARE_PROVIDER_SITE_OTHER): Payer: BC Managed Care – PPO | Admitting: Internal Medicine

## 2011-04-18 ENCOUNTER — Encounter: Payer: Self-pay | Admitting: Internal Medicine

## 2011-04-18 VITALS — BP 112/70 | HR 78 | Temp 97.8°F | Ht 72.0 in | Wt 205.0 lb

## 2011-04-18 DIAGNOSIS — F341 Dysthymic disorder: Secondary | ICD-10-CM

## 2011-04-18 DIAGNOSIS — J309 Allergic rhinitis, unspecified: Secondary | ICD-10-CM

## 2011-04-18 DIAGNOSIS — J029 Acute pharyngitis, unspecified: Secondary | ICD-10-CM

## 2011-04-18 MED ORDER — AZITHROMYCIN 250 MG PO TABS: ORAL_TABLET | ORAL | Status: AC

## 2011-04-18 NOTE — Patient Instructions (Signed)
Take all new medications as prescribed Continue all other medications as before  

## 2011-04-21 ENCOUNTER — Encounter: Payer: Self-pay | Admitting: Internal Medicine

## 2011-04-21 NOTE — Assessment & Plan Note (Signed)
Mild to mod, for antibx course,  to f/u any worsening symptoms or concerns 

## 2011-04-21 NOTE — Assessment & Plan Note (Signed)
stable overall by hx and exam, most recent lab reviewed with pt, and pt to continue medical treatment as before  Lab Results  Component Value Date   WBC 9.4 08/08/2010   HGB 16.9 08/08/2010   HCT 48.3 08/08/2010   PLT 229.0 08/08/2010   CHOL 184 08/08/2010   TRIG 67.0 08/08/2010   HDL 39.80 08/08/2010   ALT 14 08/08/2010   AST 16 08/08/2010   NA 138 08/08/2010   K 4.7 08/08/2010   CL 102 08/08/2010   CREATININE 1.0 08/08/2010   BUN 13 08/08/2010   CO2 28 08/08/2010   TSH 0.95 08/08/2010   PSA 2.00 08/08/2010

## 2011-04-21 NOTE — Assessment & Plan Note (Signed)
Mild, for OTC allegra prn,  to f/u any worsening symptoms or concerns  

## 2011-04-21 NOTE — Progress Notes (Signed)
  Subjective:    Patient ID: Leslie Harrington, male    DOB: 29-May-1956, 55 y.o.   MRN: [id number]  HPI  Here with 3 days graduyally worsening severe ST, with fever, HA, general weakness and malaise, with slight nonprod cough, but no [place], and Pt denies chest pain, increased sob or doe, wheezing, orthopnea, PND, increased LE swelling, palpitations, dizziness or syncope.  Pt denies new neurological symptoms such as new headache, or facial or extremity weakness or numbness   Pt denies polydipsia, polyuria  Pt states overall good compliance with meds and diet, wt overall stable but little exercise however.    Denies worsening depressive symptoms, suicidal ideation, or panic, though has ongoing anxiety, not increased recently.  Past Medical History  Diagnosis Date  . HYPERLIPIDEMIA 08/08/2010  . ANXIETY DEPRESSION 08/08/2010  . ERECTILE DYSFUNCTION 09/17/2007  . CONJUNCTIVITIS, ACUTE, BILATERAL 05/10/2010  . Unspecified hearing loss 02/07/2009  . SINUSITIS- ACUTE-NOS 02/07/2009  . Acute bronchitis 09/17/2007  . ALLERGIC RHINITIS 05/10/2010  . ASTHMA, CHILDHOOD 09/17/2007  . OLECRANON BURSITIS, LEFT 08/08/2010  . Wheezing 01/04/2010   No past surgical history on file.  reports that he has been smoking.  He does not have any smokeless tobacco history on file. He reports that he drinks alcohol. He reports that he does not use illicit drugs. family history includes Diabetes in his other; Hyperlipidemia in his other; and Hypertension in his other. No Known Allergies Current Outpatient Prescriptions on File Prior to Visit  Medication Sig Dispense Refill  . aspirin 81 MG EC tablet Take 81 mg by mouth daily.          Review of Systems All otherwise neg per pt     Objective:   Physical Exam BP 112/70  Pulse 78  Temp(Src) 97.8 F (36.6 C) (Oral)  Ht 6' (1.829 m)  Wt 205 lb (92.987 kg)  BMI 27.80 kg/m2  SpO2 97% Physical Exam  VS noted Constitutional: Pt appears well-developed and well-nourished.   HENT: Head: Normocephalic.  Right Ear: External ear normal.  Left Ear: External ear normal.  Bilat tm's mild erythema.  Sinus nontender.  Pharynx severe erythema with exudate Eyes: Conjunctivae and EOM are normal. Pupils are equal, round, and reactive to light.  Neck: Normal range of motion. Neck supple. but with bilat submandibular LA Cardiovascular: Normal rate and regular rhythm.   Pulmonary/Chest: Effort normal and breath sounds normal.  Neurological: Pt is alert. No cranial nerve deficit.  Skin: Skin is warm. No erythema.  Psychiatric: Pt behavior is normal. Thought content normal. mild dysphoric, 1+ anxious        Assessment & Plan:

## 2011-12-05 ENCOUNTER — Ambulatory Visit (INDEPENDENT_AMBULATORY_CARE_PROVIDER_SITE_OTHER)
Admission: RE | Admit: 2011-12-05 | Discharge: 2011-12-05 | Disposition: A | Discharge status: Home / Self Care | Payer: BC Managed Care – PPO | Source: Ambulatory Visit | Attending: Internal Medicine | Admitting: Internal Medicine

## 2011-12-05 ENCOUNTER — Encounter: Payer: Self-pay | Admitting: Internal Medicine

## 2011-12-05 ENCOUNTER — Ambulatory Visit (INDEPENDENT_AMBULATORY_CARE_PROVIDER_SITE_OTHER): Payer: BC Managed Care – PPO | Admitting: Internal Medicine

## 2011-12-05 VITALS — BP 138/80 | HR 80 | Temp 99.4°F | Resp 16 | Wt 213.0 lb

## 2011-12-05 DIAGNOSIS — J988 Other specified respiratory disorders: Secondary | ICD-10-CM

## 2011-12-05 DIAGNOSIS — R05 Cough: Secondary | ICD-10-CM

## 2011-12-05 DIAGNOSIS — R059 Cough, unspecified: Secondary | ICD-10-CM

## 2011-12-05 DIAGNOSIS — B9789 Other viral agents as the cause of diseases classified elsewhere: Secondary | ICD-10-CM

## 2011-12-05 DIAGNOSIS — J209 Acute bronchitis, unspecified: Secondary | ICD-10-CM

## 2011-12-05 DIAGNOSIS — Z23 Encounter for immunization: Secondary | ICD-10-CM

## 2011-12-05 MED ORDER — AZITHROMYCIN 500 MG PO TABS: 500.0000 mg | ORAL_TABLET | Freq: Every day | ORAL | Status: AC

## 2011-12-05 MED ORDER — HYDROCODONE-HOMATROPINE 5-1.5 MG/5ML PO SYRP: 5.0000 mL | ORAL_SOLUTION | Freq: Three times a day (TID) | ORAL | Status: AC | PRN

## 2011-12-05 NOTE — Patient Instructions (Signed)

## 2011-12-05 NOTE — Assessment & Plan Note (Signed)
Symptomatic relief

## 2011-12-05 NOTE — Assessment & Plan Note (Signed)
I will check a cxr for pna, mass, edema 

## 2011-12-05 NOTE — Assessment & Plan Note (Signed)
zpak for the infection

## 2011-12-05 NOTE — Progress Notes (Signed)
  Subjective:    Patient ID: Leslie Harrington, male    DOB: 04/13/1956, 56 y.o.   MRN: [id number]  Cough This is a new problem. The current episode started in the past 7 days. The problem has been gradually worsening. The problem occurs every few hours. The cough is productive of purulent sputum. Associated symptoms include chills, a fever, a sore throat, shortness of breath and sweats. Pertinent negatives include no chest pain, ear congestion, ear pain, headaches, heartburn, hemoptysis, myalgias, nasal congestion, postnasal drip, rash, rhinorrhea, weight loss or wheezing. The symptoms are aggravated by nothing. Risk factors for lung disease include smoking/tobacco exposure. He has tried OTC cough suppressant for the symptoms. The treatment provided mild relief. His past medical history is significant for pneumonia.      Review of Systems  Constitutional: Positive for fever and chills. Negative for weight loss, diaphoresis, activity change, appetite change, fatigue and unexpected weight change.  HENT: Positive for sore throat. Negative for ear pain, rhinorrhea and postnasal drip.   Eyes: Negative.   Respiratory: Positive for cough and shortness of breath. Negative for hemoptysis, chest tightness, wheezing and stridor.   Cardiovascular: Negative for chest pain, palpitations and leg swelling.  Gastrointestinal: Negative for heartburn, nausea, vomiting, abdominal pain, diarrhea, constipation and abdominal distention.  Genitourinary: Negative.   Musculoskeletal: Negative for myalgias, back pain, joint swelling, arthralgias and gait problem.  Skin: Negative for color change, pallor, rash and wound.  Neurological: Negative for dizziness, tremors, seizures, syncope, facial asymmetry, speech difficulty, weakness, light-headedness, numbness and headaches.  Hematological: Negative for adenopathy. Does not bruise/bleed easily.  Psychiatric/Behavioral: Negative.        Objective:   Physical Exam    Vitals reviewed. Constitutional: He is oriented to person, place, and time. He appears well-developed and well-nourished. No distress.  HENT:  Head: Normocephalic and atraumatic. No trismus in the jaw.  Right Ear: Hearing, tympanic membrane, external ear and ear canal normal.  Left Ear: Hearing, tympanic membrane, external ear and ear canal normal.  Mouth/Throat: Mucous membranes are normal. Mucous membranes are not pale, not dry and not cyanotic. No uvula swelling. Posterior oropharyngeal erythema present. No oropharyngeal exudate, posterior oropharyngeal edema or tonsillar abscesses.  Eyes: Conjunctivae are normal. Right eye exhibits no discharge. Left eye exhibits no discharge. No scleral icterus.  Neck: Normal range of motion. Neck supple. No JVD present. No tracheal deviation present. No thyromegaly present.  Cardiovascular: Normal rate, regular rhythm, normal heart sounds and intact distal pulses.  Exam reveals no gallop and no friction rub.   No murmur heard. Pulmonary/Chest: Effort normal and breath sounds normal. No stridor. No respiratory distress. He has no wheezes. He has no rales. He exhibits no tenderness.  Abdominal: Soft. Bowel sounds are normal. He exhibits no distension and no mass. There is no tenderness. There is no rebound and no guarding.  Musculoskeletal: Normal range of motion. He exhibits no edema and no tenderness.  Lymphadenopathy:    He has no cervical adenopathy.  Neurological: He is oriented to person, place, and time.  Skin: Skin is warm and dry. No rash noted. He is not diaphoretic. No erythema. No pallor.  Psychiatric: He has a normal mood and affect. His behavior is normal. Judgment and thought content normal.          Assessment & Plan:

## 2011-12-25 ENCOUNTER — Other Ambulatory Visit (INDEPENDENT_AMBULATORY_CARE_PROVIDER_SITE_OTHER): Payer: BC Managed Care – PPO

## 2011-12-25 ENCOUNTER — Ambulatory Visit (INDEPENDENT_AMBULATORY_CARE_PROVIDER_SITE_OTHER): Payer: BC Managed Care – PPO | Admitting: Internal Medicine

## 2011-12-25 ENCOUNTER — Encounter: Payer: Self-pay | Admitting: Internal Medicine

## 2011-12-25 VITALS — BP 146/82 | HR 86 | Temp 97.6°F | Resp 16

## 2011-12-25 DIAGNOSIS — M79609 Pain in unspecified limb: Secondary | ICD-10-CM

## 2011-12-25 DIAGNOSIS — R609 Edema, unspecified: Secondary | ICD-10-CM

## 2011-12-25 DIAGNOSIS — R600 Localized edema: Secondary | ICD-10-CM

## 2011-12-25 DIAGNOSIS — I803 Phlebitis and thrombophlebitis of lower extremities, unspecified: Secondary | ICD-10-CM

## 2011-12-25 DIAGNOSIS — M79605 Pain in left leg: Secondary | ICD-10-CM

## 2011-12-25 LAB — COMPREHENSIVE METABOLIC PANEL
ALT: 12 U/L (ref 0–53)
AST: 15 U/L (ref 0–37)
CO2: 27 mEq/L (ref 19–32)
Calcium: 9.3 mg/dL (ref 8.4–10.5)
Chloride: 107 mEq/L (ref 96–112)
Creatinine, Ser: 0.8 mg/dL (ref 0.4–1.5)
GFR: 109.01 mL/min (ref >60.00)
Sodium: 140 mEq/L (ref 135–145)
Total Protein: 6.9 g/dL (ref 6.0–8.3)

## 2011-12-25 LAB — CBC WITH DIFFERENTIAL/PLATELET
Basophils Absolute: 0.1 10*3/uL (ref 0.0–0.1)
Eosinophils Absolute: 0.1 10*3/uL (ref 0.0–0.7)
Hemoglobin: 14.3 g/dL (ref 13.0–17.0)
Lymphocytes Relative: 26.3 % (ref 12.0–46.0)
MCHC: 34.1 g/dL (ref 30.0–36.0)
Monocytes Relative: 5.3 % (ref 3.0–12.0)
Neutrophils Relative %: 66.5 % (ref 43.0–77.0)
Platelets: 276 10*3/uL (ref 150.0–400.0)
RDW: 14.4 % (ref 11.5–14.6)

## 2011-12-25 LAB — BRAIN NATRIURETIC PEPTIDE: Pro B Natriuretic peptide (BNP): 82 pg/mL (ref 0.0–100.0)

## 2011-12-25 LAB — URINALYSIS, ROUTINE W REFLEX MICROSCOPIC
Specific Gravity, Urine: >=1.03 (ref 1.000–1.030)
Total Protein, Urine: NEGATIVE
Urine Glucose: NEGATIVE
Urobilinogen, UA: 0.2 (ref 0.0–1.0)

## 2011-12-25 NOTE — Patient Instructions (Signed)
Edema Edema is an abnormal build-up of fluids in tissues. Because this is partly dependent on gravity (water flows to the lowest place), it is more common in the leg sand thighs (lower extremities). It is also common in the looser tissues, like around the eyes. Painless swelling of the feet and ankles is common and increases as a person ages. It may affect both legs and may include the calves or even thighs. When squeezed, the fluid may move out of the affected area and may leave a dent for a few moments. CAUSES   Prolonged standing or sitting in one place for extended periods of time. Movement helps pump tissue fluid into the veins, and absence of movement prevents this, resulting in edema.   Varicose veins. The valves in the veins do not work as well as they should. This causes fluid to leak into the tissues.   Fluid and salt overload.   Injury, burn, or surgery to the leg, ankle, or foot, may damage veins and allow fluid to leak out.   Sunburn damages vessels. Leaky vessels allow fluid to go out into the sunburned tissues.   Allergies (from insect bites or stings, medications or chemicals) cause swelling by allowing vessels to become leaky.   Protein in the blood helps keep fluid in your vessels. Low protein, as in malnutrition, allows fluid to leak out.   Hormonal changes, including pregnancy and menstruation, cause fluid retention. This fluid may leak out of vessels and cause edema.   Medications that cause fluid retention. Examples are sex hormones, blood pressure medications, steroid treatment, or anti-depressants.   Some illnesses cause edema, especially heart failure, kidney disease, or liver disease.   Surgery that cuts veins or lymph nodes, such as surgery done for the heart or for breast cancer, may result in edema.  DIAGNOSIS  Your caregiver is usually easily able to determine what is causing your swelling (edema) by simply asking what is wrong (getting a history) and examining  you (doing a physical). Sometimes x-rays, EKG (electrocardiogram or heart tracing), and blood work may be done to evaluate for underlying medical illness. TREATMENT  General treatment includes:  Leg elevation (or elevation of the affected body part).   Restriction of fluid intake.   Prevention of fluid overload.   Compression of the affected body part. Compression with elastic bandages or support stockings squeezes the tissues, preventing fluid from entering and forcing it back into the blood vessels.   Diuretics (also called water pills or fluid pills) pull fluid out of your body in the form of increased urination. These are effective in reducing the swelling, but can have side effects and must be used only under your caregiver's supervision. Diuretics are appropriate only for some types of edema.  The specific treatment can be directed at any underlying causes discovered. Heart, liver, or kidney disease should be treated appropriately. HOME CARE INSTRUCTIONS   Elevate the legs (or affected body part) above the level of the heart, while lying down.   Avoid sitting or standing still for prolonged periods of time.   Avoid putting anything directly under the knees when lying down, and do not wear constricting clothing or garters on the upper legs.   Exercising the legs causes the fluid to work back into the veins and lymphatic channels. This may help the swelling go down.   The pressure applied by elastic bandages or support stockings can help reduce ankle swelling.   A low-salt diet may help reduce fluid   retention and decrease the ankle swelling.   Take any medications exactly as prescribed.  SEEK MEDICAL CARE IF:  Your edema is not responding to recommended treatments. SEEK IMMEDIATE MEDICAL CARE IF:   You develop shortness of breath or chest pain.   You cannot breathe when you lay down; or if, while lying down, you have to get up and go to the window to get your breath.   You  are having increasing swelling without relief from treatment.   You develop a fever over 102 F (38.9 C).   You develop pain or redness in the areas that are swollen.   Tell your caregiver right away if you have gained 3 lb/1.4 kg in 1 day or 5 lb/2.3 kg in a week.  MAKE SURE YOU:   Understand these instructions.   Will watch your condition.   Will get help right away if you are not doing well or get worse.  Document Released: 11/10/2005 Document Revised: 07/23/2011 Document Reviewed: 06/28/2008 [employer] Patient Information 2012 [employer]. 

## 2011-12-25 NOTE — Progress Notes (Signed)
  Subjective:    Patient ID: Leslie Harrington, male    DOB: Jul 12, 1956, 56 y.o.   MRN: [id number]  HPI He returns c/o pain that occurs over his left upper/medial calf that has been present for about one week. He has not had any injury or trauma. He can feel a varicose vein in the area. He has been taking mortin and that helps with the pain.   Review of Systems  Constitutional: Negative for fever, chills, diaphoresis, activity change, appetite change, fatigue and unexpected weight change.  HENT: Negative.   Eyes: Negative.   Respiratory: Negative for cough, choking, shortness of breath, wheezing and stridor.   Cardiovascular: Positive for leg swelling. Negative for chest pain and palpitations.  Gastrointestinal: Negative.   Genitourinary: Negative.   Musculoskeletal: Negative.  Negative for myalgias, back pain, joint swelling, arthralgias and gait problem.  Skin: Negative for color change, pallor, rash and wound.  Neurological: Negative.   Hematological: Negative.   Psychiatric/Behavioral: Negative.  Negative for agitation.       Objective:   Physical Exam  Vitals reviewed. Constitutional: He is oriented to person, place, and time. He appears well-developed and well-nourished. No distress.  HENT:  Head: Normocephalic and atraumatic.  Mouth/Throat: Oropharynx is clear and moist. No oropharyngeal exudate.  Eyes: Conjunctivae are normal. Right eye exhibits no discharge. Left eye exhibits no discharge. No scleral icterus.  Neck: Normal range of motion. Neck supple. No JVD present. No tracheal deviation present. No thyromegaly present.  Cardiovascular: Normal rate, regular rhythm, normal heart sounds and intact distal pulses.  Exam reveals no gallop and no friction rub.   No murmur heard. Pulmonary/Chest: Effort normal and breath sounds normal. No stridor. No respiratory distress. He has no wheezes. He has no rales. He exhibits no tenderness.  Abdominal: Soft. Bowel sounds are normal. He  exhibits no distension and no mass. There is no tenderness. There is no rebound and no guarding.  Musculoskeletal:       Left lower leg: He exhibits tenderness, swelling and deformity. He exhibits no bony tenderness, no edema and no laceration.       Over the left medial/upper calf there is a large palpable but superficial varicose vein that is uncomplicated. The adjacent knee is normal and there is no pain or swelling in the popliteal fossa. The remainder of there left calk is normal with no pain or swelling.  Lymphadenopathy:    He has no cervical adenopathy.  Neurological: He is oriented to person, place, and time.  Skin: Skin is warm and dry. No rash noted. He is not diaphoretic. No erythema. No pallor.  Psychiatric: He has a normal mood and affect. His behavior is normal. Judgment and thought content normal.      Lab Results  Component Value Date   WBC 9.4 08/08/2010   HGB 16.9 08/08/2010   HCT 48.3 08/08/2010   PLT 229.0 08/08/2010   GLUCOSE 79 08/08/2010   CHOL 184 08/08/2010   TRIG 67.0 08/08/2010   HDL 39.80 08/08/2010   LDLCALC 131* 08/08/2010   ALT 14 08/08/2010   AST 16 08/08/2010   NA 138 08/08/2010   K 4.7 08/08/2010   CL 102 08/08/2010   CREATININE 1.0 08/08/2010   BUN 13 08/08/2010   CO2 28 08/08/2010   TSH 0.95 08/08/2010   PSA 2.00 08/08/2010      Assessment & Plan:

## 2011-12-26 ENCOUNTER — Encounter (INDEPENDENT_AMBULATORY_CARE_PROVIDER_SITE_OTHER): Payer: BC Managed Care – PPO | Admitting: *Deleted

## 2011-12-26 ENCOUNTER — Telehealth: Payer: Self-pay

## 2011-12-26 ENCOUNTER — Encounter: Payer: Self-pay | Admitting: Internal Medicine

## 2011-12-26 DIAGNOSIS — M79605 Pain in left leg: Secondary | ICD-10-CM

## 2011-12-26 DIAGNOSIS — R600 Localized edema: Secondary | ICD-10-CM

## 2011-12-26 DIAGNOSIS — I803 Phlebitis and thrombophlebitis of lower extremities, unspecified: Secondary | ICD-10-CM

## 2011-12-26 DIAGNOSIS — M7989 Other specified soft tissue disorders: Secondary | ICD-10-CM

## 2011-12-26 DIAGNOSIS — M79609 Pain in unspecified limb: Secondary | ICD-10-CM

## 2011-12-26 LAB — D-DIMER, QUANTITATIVE: D-Dimer, Quant: 0.75 ug/mL-FEU — ABNORMAL HIGH (ref 0.00–0.48)

## 2011-12-26 NOTE — Telephone Encounter (Signed)
Patient notified per MD.

## 2011-12-26 NOTE — Telephone Encounter (Signed)
Stay on motrin over the weekend, keep it elevated, apply warm compress, if no better next week will ask a vein doctor to see him

## 2011-12-26 NOTE — Telephone Encounter (Signed)
Harriet from [facility] called to inform MD that pt is negative for DVT. Patient would like to know from MD what's next as far as plan of care. Thanks

## 2011-12-28 ENCOUNTER — Encounter: Payer: Self-pay | Admitting: Internal Medicine

## 2011-12-28 NOTE — Assessment & Plan Note (Signed)
I will check for deep involvement, he will rest/elevate/apply warm compress and continue taking motrin as needed

## 2011-12-28 NOTE — Assessment & Plan Note (Signed)
I will check labs today to look for DVT, cellulitis, vasculitis, etc, also I have asked him to get an u/s done to see if there is a deep venous thrombosis

## 2011-12-28 NOTE — Assessment & Plan Note (Signed)
As above.

## 2013-04-26 ENCOUNTER — Emergency Department (HOSPITAL_COMMUNITY): Payer: BC Managed Care – PPO

## 2013-04-26 ENCOUNTER — Telehealth: Payer: Self-pay | Admitting: Internal Medicine

## 2013-04-26 ENCOUNTER — Ambulatory Visit (INDEPENDENT_AMBULATORY_CARE_PROVIDER_SITE_OTHER): Payer: BC Managed Care – PPO | Admitting: Emergency Medicine

## 2013-04-26 ENCOUNTER — Encounter (HOSPITAL_COMMUNITY): Payer: Self-pay | Admitting: *Deleted

## 2013-04-26 ENCOUNTER — Emergency Department (HOSPITAL_COMMUNITY)
Admission: EM | Admit: 2013-04-26 | Discharge: 2013-04-26 | Disposition: A | Discharge status: Home / Self Care | Payer: BC Managed Care – PPO | Attending: Emergency Medicine | Admitting: Emergency Medicine

## 2013-04-26 VITALS — BP 150/86 | HR 79 | Temp 98.1°F | Resp 16 | Ht 70.0 in | Wt 201.2 lb

## 2013-04-26 DIAGNOSIS — J45909 Unspecified asthma, uncomplicated: Secondary | ICD-10-CM | POA: Insufficient documentation

## 2013-04-26 DIAGNOSIS — Z8709 Personal history of other diseases of the respiratory system: Secondary | ICD-10-CM | POA: Insufficient documentation

## 2013-04-26 DIAGNOSIS — R079 Chest pain, unspecified: Secondary | ICD-10-CM

## 2013-04-26 DIAGNOSIS — H9190 Unspecified hearing loss, unspecified ear: Secondary | ICD-10-CM | POA: Insufficient documentation

## 2013-04-26 DIAGNOSIS — Z862 Personal history of diseases of the blood and blood-forming organs and certain disorders involving the immune mechanism: Secondary | ICD-10-CM | POA: Insufficient documentation

## 2013-04-26 DIAGNOSIS — R05 Cough: Secondary | ICD-10-CM | POA: Insufficient documentation

## 2013-04-26 DIAGNOSIS — Z8669 Personal history of other diseases of the nervous system and sense organs: Secondary | ICD-10-CM | POA: Insufficient documentation

## 2013-04-26 DIAGNOSIS — Z87448 Personal history of other diseases of urinary system: Secondary | ICD-10-CM | POA: Insufficient documentation

## 2013-04-26 DIAGNOSIS — F528 Other sexual dysfunction not due to a substance or known physiological condition: Secondary | ICD-10-CM | POA: Insufficient documentation

## 2013-04-26 DIAGNOSIS — R072 Precordial pain: Secondary | ICD-10-CM | POA: Insufficient documentation

## 2013-04-26 DIAGNOSIS — R059 Cough, unspecified: Secondary | ICD-10-CM | POA: Insufficient documentation

## 2013-04-26 DIAGNOSIS — Z8659 Personal history of other mental and behavioral disorders: Secondary | ICD-10-CM | POA: Insufficient documentation

## 2013-04-26 DIAGNOSIS — Z8639 Personal history of other endocrine, nutritional and metabolic disease: Secondary | ICD-10-CM | POA: Insufficient documentation

## 2013-04-26 DIAGNOSIS — Z7982 Long term (current) use of aspirin: Secondary | ICD-10-CM | POA: Insufficient documentation

## 2013-04-26 DIAGNOSIS — F17200 Nicotine dependence, unspecified, uncomplicated: Secondary | ICD-10-CM | POA: Insufficient documentation

## 2013-04-26 LAB — CBC WITH DIFFERENTIAL/PLATELET
Basophils Relative: 1 % (ref 0–1)
HCT: 43.1 % (ref 39.0–52.0)
Hemoglobin: 15.1 g/dL (ref 13.0–17.0)
MCHC: 35 g/dL (ref 30.0–36.0)
Monocytes Absolute: 0.6 10*3/uL (ref 0.1–1.0)
Monocytes Relative: 6 % (ref 3–12)
Neutro Abs: 8.5 10*3/uL — ABNORMAL HIGH (ref 1.7–7.7)

## 2013-04-26 LAB — BASIC METABOLIC PANEL
BUN: 13 mg/dL (ref 6–23)
CO2: 25 mEq/L (ref 19–32)
Chloride: 105 mEq/L (ref 96–112)
Creatinine, Ser: 0.97 mg/dL (ref 0.50–1.35)
GFR calc Af Amer: >90 mL/min (ref >90)
Glucose, Bld: 98 mg/dL (ref 70–99)

## 2013-04-26 NOTE — Progress Notes (Signed)
[facility] [address] [place] [address] [phone]  Date:  04/26/2013   Name:  Leslie Harrington   DOB:  07-Dec-1955   MRN:  [id number]  PCP:  Oliver Barre, MD    Chief Complaint: chest discomfort started this am   History of Present Illness:  Leslie Harrington is a 57 y.o. very pleasant male patient who presents with the following:  Has had a URI for a week and today developed dull pressure like pain in the chest that passes through to the back.  Says that he has experienced some sensation of a rapid heart rate but no palpitations.  No nausea or vomiting. Denies other radiation of pain or diaphoresis.  Has hyperlipidemia and smokes 1 1/2 PPD.  No prior cardiac history.  No shortness of breath or wheezing.  No PND or orthopnea.  No peripheral edema.  Pain persistent since mid morning with no provocative or ameliorating symptoms.  No improvement with over the counter medications or other home remedies. Denies other complaint or health concern today. Takes daily ASA at home.  Patient Active Problem List   Diagnosis Date Noted  . Edema of left lower extremity 12/25/2011  . Leg pain, left 12/25/2011  . Phlebitis and thrombophlebitis of lower extremities 12/25/2011  . Preventative health care 03/27/2011  . HYPERLIPIDEMIA 08/08/2010  . ANXIETY DEPRESSION 08/08/2010  . ALLERGIC RHINITIS 05/10/2010  . ERECTILE DYSFUNCTION 09/17/2007  . ASTHMA, CHILDHOOD 09/17/2007    Past Medical History  Diagnosis Date  . HYPERLIPIDEMIA 08/08/2010  . ANXIETY DEPRESSION 08/08/2010  . ERECTILE DYSFUNCTION 09/17/2007  . CONJUNCTIVITIS, ACUTE, BILATERAL 05/10/2010  . Unspecified hearing loss 02/07/2009  . SINUSITIS- ACUTE-NOS 02/07/2009  . Acute bronchitis 09/17/2007  . ALLERGIC RHINITIS 05/10/2010  . ASTHMA, CHILDHOOD 09/17/2007  . OLECRANON BURSITIS, LEFT 08/08/2010  . Wheezing 01/04/2010    No past surgical history on file.  History  Substance Use Topics  . Smoking status:  Current Every Day Smoker -- 1.50 packs/day for 40 years    Types: Cigarettes  . Smokeless tobacco: Never Used  . Alcohol Use: 3.0 oz/week    5 Cans of beer per week    Family History  Problem Relation Age of Onset  . Diabetes Other     1st degree relative  . Hyperlipidemia Other   . Hypertension Other     No Known Allergies  Medication list has been reviewed and updated.  Current Outpatient Prescriptions on File Prior to Visit  Medication Sig Dispense Refill  . aspirin 81 MG EC tablet Take 81 mg by mouth daily.         No current facility-administered medications on file prior to visit.    Review of Systems:  As per HPI, otherwise negative. '  Physical Examination: Filed Vitals:   04/26/13 1504  BP: 150/86  Pulse: 79  Temp: 98.1 F (36.7 C)  Resp: 16   Filed Vitals:   04/26/13 1504  Height: 5\' 10"  (1.778 m)  Weight: 201 lb 3.2 oz (91.264 kg)   Body mass index is 28.87 kg/(m^2). Ideal Body Weight: Weight in (lb) to have BMI = 25: 173.9  GEN: WDWN, NAD, Non-toxic, A & O x 3 HEENT: Atraumatic, Normocephalic. Neck supple. No masses, No LAD. Ears and Nose: No external deformity. CV: RRR, No M/G/R. No JVD. No thrill. No extra heart sounds. PULM: CTA B, no wheezes, crackles, rhonchi. No retractions. No resp. distress. No accessory muscle use. ABD: S, NT,  ND, +BS. No rebound. No HSM. EXTR: No c/c/e NEURO Normal gait.  PSYCH: Normally interactive. Conversant. Not depressed or anxious appearing.  Calm demeanor.    Assessment and Plan: Chest pain Negative EKG. Cardiac workup  TO ER   Signed,  Phillips Odor, MD

## 2013-04-26 NOTE — ED Notes (Signed)
Patient transported to X-ray 

## 2013-04-26 NOTE — Telephone Encounter (Signed)
Patient Information:  Caller Name: Kathlene November  Phone: [phone]  Patient: Leslie Harrington, Leslie Harrington  Gender: Male  DOB: 11-23-1956  Age: 57 Years  PCP: Oliver Barre (Adults only)  Office Follow Up:  Does the office need to follow up with this patient?: No  Instructions For The Office: N/A   Symptoms  Reason For Call & Symptoms: Pt started with chest pain in the center of his chest. onset 3 hours ago. Pt states it feels like tremendous pressure on his chest. Pt had called for an appt with Dr. Jonny Ruiz and was advised to call 911 for active chest pain. He refused and has gone to the UC on [address]. RN noted and sent notes to the office.  Reviewed Health History In EMR: N/A  Reviewed Medications In EMR: N/A  Reviewed Allergies In EMR: N/A  Reviewed Surgeries / Procedures: N/A  Date of Onset of Symptoms: 04/26/2013  Guideline(s) Used:  Chest Pain  Disposition Per Guideline:   Call EMS 911 Now  Reason For Disposition Reached:   Chest pain lasting longer than 5 minutes and ANY of the following:  Over 66 years old Over 22 years old and at least one cardiac risk factor (i.e., high blood pressure, diabetes, high cholesterol, obesity, smoker or strong family history of heart disease) Pain is crushing, pressure-like, or heavy  Took nitroglycerin and chest pain was not relieved History of heart disease (i.e., angina, heart attack, bypass surgery, angioplasty, CHF)  Advice Given:  N/A  Patient Refused Recommendation:  Patient Will Go To [facility]  He has gone to UC on [address].

## 2013-04-26 NOTE — ED Provider Notes (Signed)
History     CSN: [id number]  Arrival date & time 04/26/13  1647   First MD Initiated Contact with Patient 04/26/13 1715      Chief Complaint  Patient presents with  . Chest Pain    (Consider location/radiation/quality/duration/timing/severity/associated sxs/prior treatment) HPI Comments: Patient presents to the ED for chest pain. Pain described as a non-radiating tightness in his mid sternal region. Pain not associated with shortness of breath, palpitations, dizziness, weakness, or nausea. Patient has never had prior episodes of this before. Patient has a chronic non-productive cough- he is a daily smoker, 1.5 packs per day.  Had a cold recently with some chest congestion but did not see his PCP about this.  Has HLP but not currently on medication therapy. No hx of GERD.  No personal cardiac hx, has never seen cardiologist before.  Grandfather had MI, several other family members had problems with coronary and brain aneurysms.  Denies any abdominal pain, nausea, vomiting, diarrhea, or urinary sx. PCP- Dr. Oliver Barre    Patient is a 57 y.o. male presenting with chest pain. The history is provided by the patient.  Chest Pain Associated symptoms: cough     Past Medical History  Diagnosis Date  . HYPERLIPIDEMIA 08/08/2010  . ANXIETY DEPRESSION 08/08/2010  . ERECTILE DYSFUNCTION 09/17/2007  . CONJUNCTIVITIS, ACUTE, BILATERAL 05/10/2010  . Unspecified hearing loss 02/07/2009  . SINUSITIS- ACUTE-NOS 02/07/2009  . Acute bronchitis 09/17/2007  . ALLERGIC RHINITIS 05/10/2010  . ASTHMA, CHILDHOOD 09/17/2007  . OLECRANON BURSITIS, LEFT 08/08/2010  . Wheezing 01/04/2010    History reviewed. No pertinent past surgical history.  Family History  Problem Relation Age of Onset  . Diabetes Other     1st degree relative  . Hyperlipidemia Other   . Hypertension Other     History  Substance Use Topics  . Smoking status: Current Every Day Smoker -- 1.50 packs/day for 40 years    Types:  Cigarettes  . Smokeless tobacco: Never Used  . Alcohol Use: 3.0 oz/week    5 Cans of beer per week      Review of Systems  Respiratory: Positive for cough.   Cardiovascular: Positive for chest pain.  All other systems reviewed and are negative.    Allergies  Review of patient's allergies indicates no known allergies.  Home Medications   Current Outpatient Rx  Name  Route  Sig  Dispense  Refill  . aspirin 81 MG EC tablet   Oral   Take 81 mg by mouth daily.           . tadalafil (CIALIS) 5 MG tablet   Oral   Take 5 mg by mouth daily as needed for erectile dysfunction.           BP 167/81  Pulse 78  Temp(Src) 98.1 F (36.7 C) (Oral)  Resp 16  SpO2 98%  Physical Exam  Nursing note and vitals reviewed. Constitutional: He is oriented to person, place, and time. He appears well-developed and well-nourished. No distress.  HENT:  Head: Normocephalic and atraumatic.  Right Ear: Tympanic membrane and ear canal normal.  Left Ear: Tympanic membrane and ear canal normal.  Nose: Nose normal.  Mouth/Throat: Uvula is midline, oropharynx is clear and moist and mucous membranes are normal. No posterior oropharyngeal edema or posterior oropharyngeal erythema.  Eyes: Conjunctivae and EOM are normal. Pupils are equal, round, and reactive to light.  Neck: Normal range of motion. Neck supple.  Cardiovascular: Normal rate, regular rhythm and normal  heart sounds.   Pulmonary/Chest: Effort normal and breath sounds normal. No respiratory distress.    Coarse breath sounds bilaterally, chest pain not reproducible with palpation to chest wall  Abdominal: Soft. Bowel sounds are normal. There is no tenderness. There is no guarding.  Musculoskeletal: Normal range of motion. He exhibits no edema.  Neurological: He is alert and oriented to person, place, and time.  Skin: Skin is warm and dry. He is not diaphoretic.  Psychiatric: He has a normal mood and affect.    ED Course  Procedures  (including critical care time)   Date: 04/26/2013  Rate: 74  Rhythm: normal sinus rhythm  QRS Axis: normal  Intervals: normal  ST/T Wave abnormalities: normal  Conduction Disutrbances:none  Narrative Interpretation: NSR, no STEMI  Old EKG Reviewed: none available    Labs Reviewed  CBC WITH DIFFERENTIAL - Abnormal; Notable for the following:    WBC 10.8 (*)    Neutrophils Relative % 79 (*)    Neutro Abs 8.5 (*)    All other components within normal limits  BASIC METABOLIC PANEL  D-DIMER, QUANTITATIVE  POCT I-STAT TROPONIN I   Dg Chest 2 View  04/26/2013   *RADIOLOGY REPORT*  Clinical Data: Centralized chest pain today.  CHEST - 2 VIEW  Comparison: 12/05/2011.  Findings:  Cardiopericardial silhouette within normal limits. Mediastinal contours normal. Trachea midline.  No airspace disease or effusion.  Mild hyperinflation appears similar to prior.  IMPRESSION: No acute cardiopulmonary disease.  Emphysema.   Original Report Authenticated By: Andreas Newport, M.D.     1. Chest pain       MDM   57 y.o. M presenting to the ED for midsternal chest pain. Patient has no prior cardiac history or history of GERD. Patient is a daily smoker, 1.5 PPD.  No associated shortness of breath, dizziness, weakness, or altered mental status.  EKG normal sinus rhythm, no acute ischemic changes.  Troponin negative. Chest x-ray revealing emphysema. Lab largely WNL.  Pt is eager to return home.  Doubt ACS, PE, dissection, or other vascular collapse.  Pt afebrile, non-toxic appearing, NAD, VS stable- ok for d/c.  FU with PCP PRN.  Discussed warning signs and reasons to return to the ED.  Pt acknowledged understanding and agreed to plan.  Discussed with Dr. Bebe Shaggy who personally evaluated pt and agrees with plan.       Garlon Hatchet, PA-C 04/27/13 1241

## 2013-04-26 NOTE — ED Provider Notes (Signed)
Patient seen/examined in the Emergency Department in conjunction with Midlevel Provider Allyne Gee Patient reports chest pain Exam : awake/alert, no distress, no murmurs noted, no abnormal lung sounds Plan: he reports pain that radiates to back with deep breathing D-dimer ordered Less likely ACS or dissection at this time D-dimer pending at this time   Joya Gaskins, MD 04/26/13 1918

## 2013-04-26 NOTE — ED Notes (Signed)
Pt at work this am, started having upper mid chest tightness. Went to Urgent Care for further evaluation. Was sent to ER for further treatment of chest pain. States chest pain/tightness is also sharp in nature on inspiration and expiration. Pt reports CP radiates to back. Pt is a smoker and has a smokers cough. Pt alert and oriented x 4, neuro intact.

## 2013-04-30 NOTE — ED Provider Notes (Signed)
Medical screening examination/treatment/procedure(s) were conducted as a shared visit with non-physician practitioner(s) and myself.  I personally evaluated the patient during the encounter  Pt with atypical CP story He is well appearing Feel he is stable for outpatient management and does not require admission  Joya Gaskins, MD 04/30/13 [phone]

## 2017-10-28 ENCOUNTER — Emergency Department (HOSPITAL_BASED_OUTPATIENT_CLINIC_OR_DEPARTMENT_OTHER)
Admission: EM | Admit: 2017-10-28 | Discharge: 2017-10-28 | Disposition: A | Discharge status: Home / Self Care | Payer: Managed Care, Other (non HMO) | Attending: Emergency Medicine | Admitting: Emergency Medicine

## 2017-10-28 ENCOUNTER — Ambulatory Visit (HOSPITAL_BASED_OUTPATIENT_CLINIC_OR_DEPARTMENT_OTHER)
Admission: RE | Admit: 2017-10-28 | Discharge: 2017-10-28 | Disposition: A | Discharge status: Home / Self Care | Payer: Managed Care, Other (non HMO) | Source: Ambulatory Visit | Attending: Emergency Medicine | Admitting: Emergency Medicine

## 2017-10-28 ENCOUNTER — Other Ambulatory Visit: Payer: Self-pay

## 2017-10-28 ENCOUNTER — Encounter (HOSPITAL_BASED_OUTPATIENT_CLINIC_OR_DEPARTMENT_OTHER): Payer: Self-pay | Admitting: *Deleted

## 2017-10-28 ENCOUNTER — Encounter (HOSPITAL_BASED_OUTPATIENT_CLINIC_OR_DEPARTMENT_OTHER): Payer: Self-pay | Admitting: Emergency Medicine

## 2017-10-28 ENCOUNTER — Emergency Department (HOSPITAL_BASED_OUTPATIENT_CLINIC_OR_DEPARTMENT_OTHER)
Admission: EM | Admit: 2017-10-28 | Discharge: 2017-10-28 | Disposition: A | Discharge status: Home / Self Care | Payer: Managed Care, Other (non HMO) | Source: Home / Self Care | Attending: Emergency Medicine | Admitting: Emergency Medicine

## 2017-10-28 DIAGNOSIS — J45909 Unspecified asthma, uncomplicated: Secondary | ICD-10-CM | POA: Insufficient documentation

## 2017-10-28 DIAGNOSIS — F17210 Nicotine dependence, cigarettes, uncomplicated: Secondary | ICD-10-CM | POA: Insufficient documentation

## 2017-10-28 DIAGNOSIS — I82402 Acute embolism and thrombosis of unspecified deep veins of left lower extremity: Secondary | ICD-10-CM

## 2017-10-28 DIAGNOSIS — M79662 Pain in left lower leg: Secondary | ICD-10-CM | POA: Diagnosis present

## 2017-10-28 DIAGNOSIS — Z7982 Long term (current) use of aspirin: Secondary | ICD-10-CM | POA: Insufficient documentation

## 2017-10-28 DIAGNOSIS — I809 Phlebitis and thrombophlebitis of unspecified site: Secondary | ICD-10-CM

## 2017-10-28 DIAGNOSIS — L03116 Cellulitis of left lower limb: Secondary | ICD-10-CM | POA: Diagnosis not present

## 2017-10-28 DIAGNOSIS — I824Z2 Acute embolism and thrombosis of unspecified deep veins of left distal lower extremity: Secondary | ICD-10-CM

## 2017-10-28 LAB — PROTIME-INR
INR: 1
PROTHROMBIN TIME: 13.1 s (ref 11.4–15.2)

## 2017-10-28 LAB — CBC
HCT: 45.2 % (ref 39.0–52.0)
Hemoglobin: 15.7 g/dL (ref 13.0–17.0)
MCH: 28.6 pg (ref 26.0–34.0)
MCHC: 34.7 g/dL (ref 30.0–36.0)
MCV: 82.3 fL (ref 78.0–100.0)
PLATELETS: 230 10*3/uL (ref 150–400)
RBC: 5.49 MIL/uL (ref 4.22–5.81)
RDW: 14.5 % (ref 11.5–15.5)
WBC: 7.9 10*3/uL (ref 4.0–10.5)

## 2017-10-28 LAB — COMPREHENSIVE METABOLIC PANEL
ALT: 9 U/L — AB (ref 17–63)
ANION GAP: 7 (ref 5–15)
AST: 18 U/L (ref 15–41)
Albumin: 3.9 g/dL (ref 3.5–5.0)
Alkaline Phosphatase: 73 U/L (ref 38–126)
BUN: 12 mg/dL (ref 6–20)
CHLORIDE: 104 mmol/L (ref 101–111)
CO2: 24 mmol/L (ref 22–32)
CREATININE: 0.83 mg/dL (ref 0.61–1.24)
Calcium: 9 mg/dL (ref 8.9–10.3)
Glucose, Bld: 89 mg/dL (ref 65–99)
POTASSIUM: 3.9 mmol/L (ref 3.5–5.1)
SODIUM: 135 mmol/L (ref 135–145)
Total Bilirubin: 0.7 mg/dL (ref 0.3–1.2)
Total Protein: 7.2 g/dL (ref 6.5–8.1)

## 2017-10-28 LAB — APTT: APTT: 28 s (ref 24–36)

## 2017-10-28 MED ORDER — CEPHALEXIN 500 MG PO CAPS: 500.0000 mg | ORAL_CAPSULE | Freq: Four times a day (QID) | ORAL | 0 refills | Status: DC

## 2017-10-28 MED ORDER — CEPHALEXIN 250 MG PO CAPS
500.0000 mg | ORAL_CAPSULE | Freq: Once | ORAL | Status: AC
  Administered 2017-10-28: 500 mg via ORAL
  Filled 2017-10-28: qty 2

## 2017-10-28 MED ORDER — LIDOCAINE HCL (PF) 1 % IJ SOLN
INTRAMUSCULAR | Status: AC
Start: 2017-10-28 — End: 2017-10-28
  Administered 2017-10-28: 2.1 mL
  Filled 2017-10-28: qty 5

## 2017-10-28 MED ORDER — RIVAROXABAN 15 MG PO TABS
15.0000 mg | ORAL_TABLET | Freq: Once | ORAL | Status: AC
  Administered 2017-10-28: 15 mg via ORAL
  Filled 2017-10-28: qty 1

## 2017-10-28 MED ORDER — RIVAROXABAN (XARELTO) VTE STARTER PACK (15 & 20 MG): ORAL_TABLET | ORAL | 0 refills | Status: DC

## 2017-10-28 MED ORDER — CEFTRIAXONE SODIUM 1 G IJ SOLR
INTRAMUSCULAR | Status: AC
  Administered 2017-10-28: 1 g via INTRAMUSCULAR
  Filled 2017-10-28: qty 10

## 2017-10-28 MED ORDER — RIVAROXABAN (XARELTO) EDUCATION KIT FOR DVT/PE PATIENTS: PACK | Freq: Once | Status: DC

## 2017-10-28 MED ORDER — CEFTRIAXONE SODIUM 1 G IJ SOLR
1.0000 g | Freq: Once | INTRAMUSCULAR | Status: AC
  Filled 2017-10-28: qty 10

## 2017-10-28 MED FILL — CEPHALEXIN 500 MG CAPSULE: 500 | 7 days supply | Qty: 28 | Fill #0

## 2017-10-28 MED FILL — XARELTO STARTER PACK: 15 & 20 | 30 days supply | Qty: 51 | Fill #0

## 2017-10-28 NOTE — ED Provider Notes (Signed)
[facility] EMERGENCY DEPARTMENT Provider Note   CSN: [id number] Arrival date & time: 10/28/17  0301     History   Chief Complaint Chief Complaint  Patient presents with  . Leg Pain    HPI Leslie Harrington is a 61 y.o. male.  Patient is a 61 year old male with history of hypercholesterolemia presenting for evaluation of left lower leg pain and swelling.  This started yesterday and is worsening.  He describes redness and swelling to the medial aspect of his left thigh extending beyond the knee and into the upper calf.  This began in the absence of any injury or trauma.  He denies any fevers or chills.  He denies any chest pain or shortness of breath.  He has had this happen in the past, however no DVT was identified.   The history is provided by the patient.  Leg Pain   This is a new problem. The current episode started yesterday. The problem occurs constantly. The problem has been rapidly worsening. The pain is moderate. Pertinent negatives include no numbness and no stiffness. He has tried nothing for the symptoms.    Past Medical History:  Diagnosis Date  . Acute bronchitis 09/17/2007  . ALLERGIC RHINITIS 05/10/2010  . ANXIETY DEPRESSION 08/08/2010  . ASTHMA, CHILDHOOD 09/17/2007  . CONJUNCTIVITIS, ACUTE, BILATERAL 05/10/2010  . ERECTILE DYSFUNCTION 09/17/2007  . HYPERLIPIDEMIA 08/08/2010  . OLECRANON BURSITIS, LEFT 08/08/2010  . SINUSITIS- ACUTE-NOS 02/07/2009  . Unspecified hearing loss 02/07/2009  . Wheezing 01/04/2010    Patient Active Problem List   Diagnosis Date Noted  . Edema of left lower extremity 12/25/2011  . Leg pain, left 12/25/2011  . Phlebitis and thrombophlebitis of lower extremities 12/25/2011  . Preventative health care 03/27/2011  . HYPERLIPIDEMIA 08/08/2010  . ANXIETY DEPRESSION 08/08/2010  . ALLERGIC RHINITIS 05/10/2010  . ERECTILE DYSFUNCTION 09/17/2007  . ASTHMA, CHILDHOOD 09/17/2007    History reviewed. No pertinent surgical  history.     Home Medications    Prior to Admission medications   Medication Sig Start Date End Date Taking? Authorizing Provider  aspirin 81 MG EC tablet Take 81 mg by mouth daily.      [provider]  tadalafil (CIALIS) 5 MG tablet Take 5 mg by mouth daily as needed for erectile dysfunction.    [provider]    Family History Family History  Problem Relation Age of Onset  . Diabetes Other        1st degree relative  . Hyperlipidemia Other   . Hypertension Other     Social History Social History   Tobacco Use  . Smoking status: Current Every Day Smoker    Packs/day: 1.50    Years: 40.00    Pack years: 60.00    Types: Cigarettes  . Smokeless tobacco: Never Used  Substance Use Topics  . Alcohol use: Yes    Alcohol/week: 3.0 oz    Types: 5 Cans of beer per week  . Drug use: No     Allergies   Patient has no known allergies.   Review of Systems Review of Systems  Musculoskeletal: Negative for stiffness.  Neurological: Negative for numbness.  All other systems reviewed and are negative.    Physical Exam Updated Vital Signs BP (!) 174/100 (BP Location: Left Arm)   Pulse 85   Temp (!) 97.5 F (36.4 C) (Oral)   Resp 20   Ht 6' (1.829 m)   Wt 96.2 kg (212 lb)   SpO2  98%   BMI 28.75 kg/m   Physical Exam  Constitutional: He is oriented to person, place, and time. He appears well-developed and well-nourished. No distress.  HENT:  Head: Normocephalic and atraumatic.  Neck: Normal range of motion. Neck supple.  Pulmonary/Chest: Effort normal.  Musculoskeletal: Normal range of motion.  There is erythema, warmth, and induration to the left distal medial thigh extending beyond the knee and into the medial aspect of the left calf.  There is no distal swelling or edema.  There is no calf tenderness.  Homans sign is absent.  DP pulses are easily palpable and sensation is intact to the entire foot.  Neurological: He is alert and oriented to  person, place, and time.  Skin: Skin is warm and dry. He is not diaphoretic.  Nursing note and vitals reviewed.    ED Treatments / Results  Labs (all labs ordered are listed, but only abnormal results are displayed) Labs Reviewed - No data to display  EKG  EKG Interpretation None       Radiology No results found.  Procedures Procedures (including critical care time)  Medications Ordered in ED Medications  cefTRIAXone (ROCEPHIN) injection 1 g (not administered)  cephALEXin (KEFLEX) capsule 500 mg (not administered)     Initial Impression / Assessment and Plan / ED Course  I have reviewed the triage vital signs and the nursing notes.  Pertinent labs & imaging results that were available during my care of the patient were reviewed by me and considered in my medical decision making (see chart for details).  This appears to be cellulitis.  This will be treated with Rocephin and Keflex.  I doubt DVT, but he will return in the morning for an ultrasound to rule out this possibility.  Final Clinical Impressions(s) / ED Diagnoses   Final diagnoses:  None    ED Discharge Orders    None       Veryl Speak, MD 10/28/17 [phone]

## 2017-10-28 NOTE — ED Provider Notes (Signed)
[facility] Provider Note   CSN: [id number] Arrival date & time: 10/28/17  1031     History   Chief Complaint Chief Complaint  Patient presents with  . Follow-up    HPI Leslie Harrington is a 61 y.o. male.  HPI Patient presents for follow-up ultrasound Doppler of the left lower extremity.  Was seen yesterday and diagnosed with left leg cellulitis.  Was placed on Keflex.  Patient states he has had pain and swelling to the left leg for the past 3 days.  No fever or chills.  No known injury.  Denies any chest pain or shortness of breath. Past Medical History:  Diagnosis Date  . Acute bronchitis 09/17/2007  . ALLERGIC RHINITIS 05/10/2010  . ANXIETY DEPRESSION 08/08/2010  . ASTHMA, CHILDHOOD 09/17/2007  . CONJUNCTIVITIS, ACUTE, BILATERAL 05/10/2010  . ERECTILE DYSFUNCTION 09/17/2007  . HYPERLIPIDEMIA 08/08/2010  . OLECRANON BURSITIS, LEFT 08/08/2010  . SINUSITIS- ACUTE-NOS 02/07/2009  . Unspecified hearing loss 02/07/2009  . Wheezing 01/04/2010    Patient Active Problem List   Diagnosis Date Noted  . Edema of left lower extremity 12/25/2011  . Leg pain, left 12/25/2011  . Phlebitis and thrombophlebitis of lower extremities 12/25/2011  . Preventative health care 03/27/2011  . HYPERLIPIDEMIA 08/08/2010  . ANXIETY DEPRESSION 08/08/2010  . ALLERGIC RHINITIS 05/10/2010  . ERECTILE DYSFUNCTION 09/17/2007  . ASTHMA, CHILDHOOD 09/17/2007    No past surgical history on file.     Home Medications    Prior to Admission medications   Medication Sig Start Date End Date Taking? Authorizing Provider  aspirin 81 MG EC tablet Take 81 mg by mouth daily.      [provider]  cephALEXin (KEFLEX) 500 MG capsule Take 1 capsule (500 mg total) by mouth 4 (four) times daily. 10/28/17   Veryl Speak, MD  Rivaroxaban 15 & 20 MG TBPK Take as directed on package: Start with one '15mg'$  tablet by mouth twice a day with food. On Day 22, switch to one '20mg'$  tablet  once a day with food. 10/28/17   Julianne Rice, MD  tadalafil (CIALIS) 5 MG tablet Take 5 mg by mouth daily as needed for erectile dysfunction.    [provider]    Family History Family History  Problem Relation Age of Onset  . Diabetes Other        1st degree relative  . Hyperlipidemia Other   . Hypertension Other     Social History Social History   Tobacco Use  . Smoking status: Current Every Day Smoker    Packs/day: 1.50    Years: 40.00    Pack years: 60.00    Types: Cigarettes  . Smokeless tobacco: Never Used  Substance Use Topics  . Alcohol use: Yes    Alcohol/week: 3.0 oz    Types: 5 Cans of beer per week  . Drug use: No     Allergies   Patient has no known allergies.   Review of Systems Review of Systems  Constitutional: Negative for chills and fever.  Respiratory: Negative for cough and shortness of breath.   Cardiovascular: Positive for leg swelling. Negative for chest pain and palpitations.  Gastrointestinal: Negative for abdominal pain, diarrhea, nausea and vomiting.  Musculoskeletal: Negative for back pain, myalgias and neck pain.  Skin: Positive for color change. Negative for rash and wound.  Neurological: Negative for dizziness, weakness, light-headedness, numbness and headaches.  All other systems reviewed and are negative.    Physical Exam Updated  Vital Signs BP (!) 162/89 (BP Location: Left Arm)   Pulse 74   Temp 98.2 F (36.8 C) (Oral)   Resp 16   Ht 6' (1.829 m)   Wt 96.2 kg (212 lb)   SpO2 99%   BMI 28.75 kg/m   Physical Exam  Constitutional: He is oriented to person, place, and time. He appears well-developed and well-nourished.  HENT:  Head: Normocephalic and atraumatic.  Mouth/Throat: Oropharynx is clear and moist.  Eyes: EOM are normal. Pupils are equal, round, and reactive to light.  Neck: Normal range of motion. Neck supple.  Cardiovascular: Normal rate and regular rhythm.  Pulmonary/Chest: Effort normal and  breath sounds normal. No stridor. No respiratory distress. He has no wheezes. He has no rales. He exhibits no tenderness.  Abdominal: Soft. Bowel sounds are normal. There is no tenderness. There is no rebound and no guarding.  Musculoskeletal: Normal range of motion. He exhibits edema and tenderness.  Patient with streaking redness from the medial surface of the left calf up to the distal thigh.  Underlying superficial vein with tenderness to palpation and some firmness.  Consistent with thrombophlebitis.  Patient also has mild left calf swelling and tenderness.  Distal pulses intact.  Neurological: He is alert and oriented to person, place, and time.  5/5 motor in all extremities.  Sensation fully intact.  Skin: Skin is warm and dry. No rash noted. There is erythema.  Psychiatric: He has a normal mood and affect. His behavior is normal.  Nursing note and vitals reviewed.    ED Treatments / Results  Labs (all labs ordered are listed, but only abnormal results are displayed) Labs Reviewed  COMPREHENSIVE METABOLIC PANEL - Abnormal; Notable for the following components:      Result Value   ALT 9 (*)    All other components within normal limits  CBC  PROTIME-INR  APTT    EKG  EKG Interpretation None       Radiology US Venous Img Lower Unilateral Left  Result Date: 10/28/2017 CLINICAL DATA:  61 year old male with a history of pain and swelling EXAM: LEFT LOWER EXTREMITY VENOUS DOPPLER ULTRASOUND TECHNIQUE: Gray-scale sonography with graded compression, as well as color Doppler and duplex ultrasound were performed to evaluate the lower extremity deep venous systems from the level of the common femoral vein and including the common femoral, femoral, profunda femoral, popliteal and calf veins including the posterior tibial, peroneal and gastrocnemius veins when visible. The superficial great saphenous vein was also interrogated. Spectral Doppler was utilized to evaluate flow at rest and  with distal augmentation maneuvers in the common femoral, femoral and popliteal veins. COMPARISON:  None. FINDINGS: Contralateral Common Femoral Vein: Respiratory phasicity is normal and symmetric with the symptomatic side. No evidence of thrombus. Normal compressibility. Common Femoral Vein: No evidence of thrombus. Normal compressibility, respiratory phasicity and response to augmentation. Saphenofemoral Junction: No evidence of thrombus. Normal compressibility and flow on color Doppler imaging. Profunda Femoral Vein: No evidence of thrombus. Normal compressibility and flow on color Doppler imaging. Femoral Vein: No evidence of thrombus. Normal compressibility, respiratory phasicity and response to augmentation. Popliteal Vein: No evidence of thrombus. Normal compressibility, respiratory phasicity and response to augmentation. Calf Veins: Peroneal vein and posterior tibial vein patent. Noncompressible gastrocnemius vein. No extension into the popliteal vein. Superficial Great Saphenous Vein: Great saphenous vein thrombosed from the midthigh to the ankle. Other Findings:  None. IMPRESSION: Sonographic survey negative for DVT involving the left common femoral, femoral, popliteal vein. Study  is positive for DVT involving the gastrocnemius vein. Superficial thrombophlebitis with thrombosis of the great saphenous vein from the midthigh to the ankle. Signed, Dulcy Fanny. Earleen Newport, DO [facility] Electronically Signed   By: Corrie Mckusick D.O.   On: 10/28/2017 09:23    Procedures Procedures (including critical care time)  Medications Ordered in ED Medications  rivaroxaban [facility] Reasons) Education Kit for DVT/PE patients ( Does not apply Not Given 10/28/17 1317)  Rivaroxaban (XARELTO) tablet 15 mg (15 mg Oral Given 10/28/17 1316)     Initial Impression / Assessment and Plan / ED Course  I have reviewed the triage vital signs and the nursing notes.  Pertinent  labs & imaging results that were available during my care of the patient were reviewed by me and considered in my medical decision making (see chart for details).     Patient with DVT study positive for gastrocnemius thrombus as well as changes consistent with superficial thrombophlebitis.  Discussed with patient pros and cons of anticoagulants.  Will get baseline labs and initiate starter pack.  Have advised he will need for close follow-up with his primary physician. Given first dose of Xarelto and education kit.  Return precautions given. Final Clinical Impressions(s) / ED Diagnoses   Final diagnoses:  Acute deep vein thrombosis (DVT) of distal end of left lower extremity [facility])  Thrombophlebitis    ED Discharge Orders        Ordered    Rivaroxaban 15 & 20 MG TBPK     10/28/17 1317       Julianne Rice, MD 10/28/17 1318

## 2017-10-28 NOTE — ED Triage Notes (Signed)
Pt here for positive results of DVT studies.

## 2017-10-28 NOTE — Discharge Instructions (Signed)
Keflex as prescribed.  Return to radiology tomorrow at the given time for an ultrasound to rule out the possibility of a blood clot.

## 2017-10-28 NOTE — ED Triage Notes (Signed)
Left leg pain onset Sunday redness and swelling noted  Increased pain w touch and movement pj leg got wrapped around leg,  Saw a vein swelling

## 2017-11-05 ENCOUNTER — Encounter: Payer: Self-pay | Admitting: Family Medicine

## 2017-11-05 ENCOUNTER — Ambulatory Visit: Payer: Self-pay

## 2017-11-05 ENCOUNTER — Encounter: Payer: Self-pay | Admitting: Gastroenterology

## 2017-11-05 ENCOUNTER — Ambulatory Visit (INDEPENDENT_AMBULATORY_CARE_PROVIDER_SITE_OTHER): Payer: Managed Care, Other (non HMO) | Admitting: Family Medicine

## 2017-11-05 VITALS — BP 128/80 | HR 70 | Temp 97.6°F | Ht 72.0 in | Wt 205.5 lb

## 2017-11-05 DIAGNOSIS — R195 Other fecal abnormalities: Secondary | ICD-10-CM

## 2017-11-05 DIAGNOSIS — Z23 Encounter for immunization: Secondary | ICD-10-CM

## 2017-11-05 DIAGNOSIS — R3912 Poor urinary stream: Secondary | ICD-10-CM | POA: Diagnosis not present

## 2017-11-05 DIAGNOSIS — Z0000 Encounter for general adult medical examination without abnormal findings: Secondary | ICD-10-CM

## 2017-11-05 DIAGNOSIS — I82402 Acute embolism and thrombosis of unspecified deep veins of left lower extremity: Secondary | ICD-10-CM | POA: Diagnosis not present

## 2017-11-05 DIAGNOSIS — Z720 Tobacco use: Secondary | ICD-10-CM

## 2017-11-05 DIAGNOSIS — R3911 Hesitancy of micturition: Secondary | ICD-10-CM | POA: Insufficient documentation

## 2017-11-05 DIAGNOSIS — N401 Enlarged prostate with lower urinary tract symptoms: Secondary | ICD-10-CM

## 2017-11-05 MED ORDER — RIVAROXABAN 20 MG PO TABS
20.0000 mg | ORAL_TABLET | Freq: Every day | ORAL | 2 refills | Status: DC
Start: 2017-11-05 — End: 2018-02-15

## 2017-11-05 NOTE — Progress Notes (Signed)
Subjective:  Patient ID: Leslie Harrington, male    DOB: [birth year]  Age: 61 y.o. MRN: [id number]  CC: Establish Care   HPI Leslie Harrington presents for establishment of care and follow-up of a DVT involving his left leg 8 days ago.  He has been taking Xarelto without issue.  He has no chest pain shortness of breath.  He has no prior history of DVT.  He does have a history of phlebitis.  He has no family history of DVT.  He has smoked for over 30 years.  He does have a more recent history of a weak urinary stream.  He denies a change in bowel habits or hematuria or blood in his stool.  He has never had a colonoscopy or a CT of his chest.  He feels well and has not been losing weight.  He works in Loss adjuster, [employer].  He lives with his wife children.  His youngest child is 41 years old.  This child came along for 10 years after he had a vasectomy.  His 85 year old mother lives with them.  His father passed at age 12 secondary to dementia.  His father was diagnosed with prostate cancer at an advanced age.  He has an occasional cough but has had no fever or hemoptysis. He rarely drinks etoh and does not use illicit drugs.   History Jarick has a past medical history of Acute bronchitis (09/17/2007), ALLERGIC RHINITIS (05/10/2010), ANXIETY DEPRESSION (08/08/2010), ASTHMA, CHILDHOOD (09/17/2007), CONJUNCTIVITIS, ACUTE, BILATERAL (05/10/2010), ERECTILE DYSFUNCTION (09/17/2007), HYPERLIPIDEMIA (08/08/2010), OLECRANON BURSITIS, LEFT (08/08/2010), SINUSITIS- ACUTE-NOS (02/07/2009), Unspecified hearing loss (02/07/2009), and Wheezing (01/04/2010).   He has no past surgical history on file.   His family history includes Diabetes in his other; Hyperlipidemia in his other; Hypertension in his other.He reports that he has been smoking cigarettes.  He has a 60.00 pack-year smoking history. he has never used smokeless tobacco. He reports that he drinks about 3.0 oz of alcohol per week. He reports that he does  not use drugs.  Outpatient Medications Prior to Visit  Medication Sig Dispense Refill  . Rivaroxaban 15 & 20 MG TBPK Take as directed on package: Start with one 15mg  tablet by mouth twice a day with food. On Day 22, switch to one 20mg  tablet once a day with food. 51 each 0  . aspirin 81 MG EC tablet Take 81 mg by mouth daily.      . cephALEXin (KEFLEX) 500 MG capsule Take 1 capsule (500 mg total) by mouth 4 (four) times daily. 28 capsule 0  . tadalafil (CIALIS) 5 MG tablet Take 5 mg by mouth daily as needed for erectile dysfunction.     No facility-administered medications prior to visit.     ROS Review of Systems  Constitutional: Negative for appetite change, chills, diaphoresis, fatigue, fever and unexpected weight change.  HENT: Negative.   Eyes: Negative.   Respiratory: Positive for cough. Negative for chest tightness, shortness of breath and wheezing.   Cardiovascular: Positive for leg swelling. Negative for chest pain and palpitations.  Gastrointestinal: Negative for abdominal distention, abdominal pain, anal bleeding, blood in stool, constipation and rectal pain.  Endocrine: Negative for polyphagia and polyuria.  Genitourinary: Negative for decreased urine volume, discharge, dysuria, hematuria and urgency.  Musculoskeletal: Negative for joint swelling.  Skin: Negative for color change, rash and wound.  Allergic/Immunologic: Negative for immunocompromised state.  Neurological: Negative for weakness and numbness.  Hematological: Negative for adenopathy. Does not bruise/bleed easily.  Psychiatric/Behavioral: Negative for agitation and dysphoric mood.    Objective:  BP 128/80 (BP Location: Left Arm, Patient Position: Sitting, Cuff Size: Normal)   Pulse 70   Temp 97.6 F (36.4 C) (Oral)   Ht 6' (1.829 m)   Wt 205 lb 8 oz (93.2 kg)   SpO2 99%   BMI 27.87 kg/m   Physical Exam  Constitutional: He is oriented to person, place, and time. He appears well-developed and  well-nourished. No distress.  HENT:  Head: Normocephalic and atraumatic.  Right Ear: External ear normal.  Left Ear: External ear normal.  Mouth/Throat: Oropharynx is clear and moist. No oropharyngeal exudate.  Eyes: Conjunctivae are normal. Pupils are equal, round, and reactive to light. Right eye exhibits no discharge. Left eye exhibits no discharge. No scleral icterus.  Neck: Neck supple. No JVD present. No tracheal deviation present. No thyromegaly present.  Cardiovascular: Normal rate, regular rhythm and normal heart sounds.  Pulmonary/Chest: Effort normal. No stridor. He has decreased breath sounds. He has no wheezes. He has no rhonchi. He has no rales.  Abdominal: Soft. Bowel sounds are normal. He exhibits no distension. There is no tenderness. There is no rebound and no guarding.  Genitourinary: Rectal exam shows guaiac positive stool. Rectal exam shows no external hemorrhoid, no internal hemorrhoid, no fissure, no mass, no tenderness and anal tone normal. Prostate is not enlarged and not tender.  Lymphadenopathy:    He has no cervical adenopathy.  Neurological: He is alert and oriented to person, place, and time.  Skin: Skin is warm and dry. No rash noted. He is not diaphoretic.  Psychiatric: He has a normal mood and affect. His behavior is normal.      Assessment & Plan:   Matyas was seen today for establish care.  Diagnoses and all orders for this visit:  Need for influenza vaccination -     Flu Vaccine QUAD 36+ mos IM  Acute deep vein thrombosis (DVT) of left lower extremity, unspecified vein (HCC) -     rivaroxaban (XARELTO) 20 MG TABS tablet; Take 1 tablet (20 mg total) by mouth daily with supper. -     Ambulatory referral to Gastroenterology -     CT CHEST LUNG CA SCREEN LOW DOSE W/O CM; Future  Tobacco abuse -     CT CHEST LUNG CA SCREEN LOW DOSE W/O CM; Future  Heme positive stool -     Ambulatory referral to Gastroenterology  Healthcare maintenance -      CBC; Future -     Comprehensive metabolic panel; Future -     Lipid panel; Future -     PSA; Future -     TSH; Future -     Urinalysis, Routine w reflex microscopic; Future -     Ambulatory referral to Gastroenterology  Tobacco use -     CT CHEST LUNG CA SCREEN LOW DOSE W/O CM; Future  Benign prostatic hyperplasia with weak urinary stream   I have discontinued Otelia Limes. Ciani's aspirin, tadalafil, and cephALEXin. I am also having him start on rivaroxaban. Additionally, I am having him maintain his Rivaroxaban.  Meds ordered this encounter  Medications  . rivaroxaban (XARELTO) 20 MG TABS tablet    Sig: Take 1 tablet (20 mg total) by mouth daily with supper.    Dispense:  30 tablet    Refill:  2   Patient will follow-up after the first of the year for his fasting blood work.  He will also proceed  with his CT of the chest and colonoscopy.  With his first occurrence of a DVT we will continue his Xarelto for 3 months and reevaluate.  He will report to the ER immediately with any chest pain or shortness of breath.  He will otherwise see me in 6 weeks.  He is not interested in starting medical treatment for his BPH symptoms.  Follow-up: Return in about 6 weeks (around 12/17/2017).  Libby Maw, MD

## 2017-11-05 NOTE — Telephone Encounter (Signed)
Pt called for nose bleed. Nosebleed started 5 minutes before call. Pt is on Xarelto due to blood thinner due to blood clot to left leg. Pt was seen by PCP this am. Nosebleed is on the left side and pt states he lost 6-7 drops of blood. Care advice given and disposition said to see physician within 24 hours. Called PCP office and spoke to Judson Roch. Judson Roch stated she notified Dr. Ethelene Hal and said to try and stop the nosebleed by packing nose and tp put guaze under lip to help. Care advice given.  Reason for Disposition . Taking Coumadin (warfarin) or other strong blood thinner, or known bleeding disorder (e.g., thrombocytopenia)  Answer Assessment - Initial Assessment Questions 1. AMOUNT OF BLEEDING: "How bad is the bleeding?" "How much blood was lost?" "Has the bleeding stopped?"   - MILD: needed a couple tissues   - MODERATE: needed many tissues   - SEVERE: large blood clots, soaked many tissues, lasted more than 30 minutes      Mild, lost 6-7 drops of blood, bleeding has not stopped  2. ONSET: "When did the nosebleed start?"      1:50 pm  3. FREQUENCY: "How many nosebleeds have you had in the last 24 hours?"      Just this one 4. RECURRENT SYMPTOMS: "Have there been other recent nosebleeds?" If so, ask: "How long did it take you to stop the bleeding?" "What worked best?"      No other nosebleeds, the bleeding has not stopped 5. CAUSE: "What do you think caused this nosebleed?"     Pt has no idea what is causing. He says he is assuming that blood thinners have caused it 6. LOCAL FACTORS: "Do you have any cold symptoms?", "Have you been rubbing or picking at your nose?"     No cold symptoms. No rubbing or picking nose 7. SYSTEMIC FACTORS: "Do you have high blood pressure or any bleeding problems?"     No HTN, blood clot in left leg is on Xarelto 8. BLOOD THINNERS: "Do you take any blood thinners?" (e.g., coumadin, heparin, aspirin, Plavix)     Xarelto 9. OTHER SYMPTOMS: "Do you have any other  symptoms?" (e.g., lightheadedness)     States he was a little lightheaded earlier but does not feel that way now. 10. PREGNANCY: "Is there any chance you are pregnant?" "When was your last menstrual period?"       n/a  Protocols used: [id number]

## 2017-11-09 ENCOUNTER — Ambulatory Visit: Payer: Managed Care, Other (non HMO) | Admitting: Family Medicine

## 2017-12-23 ENCOUNTER — Ambulatory Visit (INDEPENDENT_AMBULATORY_CARE_PROVIDER_SITE_OTHER): Payer: Managed Care, Other (non HMO) | Admitting: Gastroenterology

## 2017-12-23 ENCOUNTER — Encounter: Payer: Self-pay | Admitting: Gastroenterology

## 2017-12-23 VITALS — BP 128/80 | HR 74 | Ht 72.0 in | Wt 205.5 lb

## 2017-12-23 DIAGNOSIS — Z7901 Long term (current) use of anticoagulants: Secondary | ICD-10-CM | POA: Diagnosis not present

## 2017-12-23 DIAGNOSIS — Z1211 Encounter for screening for malignant neoplasm of colon: Secondary | ICD-10-CM | POA: Diagnosis not present

## 2017-12-23 DIAGNOSIS — Z1212 Encounter for screening for malignant neoplasm of rectum: Secondary | ICD-10-CM | POA: Diagnosis not present

## 2017-12-23 MED ORDER — NA SULFATE-K SULFATE-MG SULF 17.5-3.13-1.6 GM/177ML PO SOLN: 1.0000 | Freq: Once | ORAL | 0 refills | Status: AC

## 2017-12-23 NOTE — Patient Instructions (Signed)
You have been scheduled for a colonoscopy. Please follow written instructions given to you at your visit today.  Please pick up your prep supplies at the pharmacy within the next 1-3 days. If you use inhalers (even only as needed), please bring them with you on the day of your procedure. Your physician has requested that you go to www.startemmi.com and enter the access code given to you at your visit today. This web site gives a general overview about your procedure. However, you should still follow specific instructions given to you by our office regarding your preparation for the procedure.  If you have not come off your Xarelto prior to the Colonoscopy, please contact our office so we can reschedule your procedure.   Normal BMI (Body Mass Index- based on height and weight) is between 19 and 25. Your BMI today is Body mass index is 27.87 kg/m. Marland Kitchen Please consider follow up  regarding your BMI with your Primary Care Provider.  Thank you for choosing me and [facility].  Pricilla Riffle. Dagoberto Ligas., MD., [facility] Regal

## 2017-12-23 NOTE — Progress Notes (Signed)
History of Present Illness: This is a 62 year old male referred by Libby Maw,* for the evaluation of CRC screening on Xarelto.  Patient relates he was diagnosed with a left lower extremity DVT and started on Xarelto about 1 month ago.  He has no gastrointestinal complaints.  He has not previously undergone colonoscopy. Denies weight loss, abdominal pain, constipation, diarrhea, change in stool caliber, melena, hematochezia, nausea, vomiting, dysphagia, reflux symptoms, chest pain.   No Known Allergies Outpatient Medications Prior to Visit  Medication Sig Dispense Refill  . rivaroxaban (XARELTO) 20 MG TABS tablet Take 1 tablet (20 mg total) by mouth daily with supper. 30 tablet 2  . Rivaroxaban 15 & 20 MG TBPK Take as directed on package: Start with one 15mg  tablet by mouth twice a day with food. On Day 22, switch to one 20mg  tablet once a day with food. 51 each 0   No facility-administered medications prior to visit.    Past Medical History:  Diagnosis Date  . Acute bronchitis 09/17/2007  . ALLERGIC RHINITIS 05/10/2010  . ANXIETY DEPRESSION 08/08/2010  . ASTHMA, CHILDHOOD 09/17/2007  . CONJUNCTIVITIS, ACUTE, BILATERAL 05/10/2010  . ERECTILE DYSFUNCTION 09/17/2007  . HYPERLIPIDEMIA 08/08/2010  . OLECRANON BURSITIS, LEFT 08/08/2010  . SINUSITIS- ACUTE-NOS 02/07/2009  . Unspecified hearing loss 02/07/2009  . Wheezing 01/04/2010   Past Surgical History:  Procedure Laterality Date  . WISDOM TOOTH EXTRACTION     As a teen   Social History   Socioeconomic History  . Marital status: Single    Spouse name: None  . Number of children: 2  . Years of education: None  . Highest education level: None  Social Needs  . Financial resource strain: None  . Food insecurity - worry: None  . Food insecurity - inability: None  . Transportation needs - medical: None  . Transportation needs - non-medical: None  Occupational History  . None  Tobacco Use  . Smoking status: Current  Every Day Smoker    Packs/day: 1.50    Years: 40.00    Pack years: 60.00    Types: Cigarettes  . Smokeless tobacco: Never Used  Substance and Sexual Activity  . Alcohol use: Yes    Alcohol/week: 3.0 oz    Types: 5 Cans of beer per week  . Drug use: No  . Sexual activity: Not Currently  Other Topics Concern  . None  Social History Narrative  . None   Family History  Problem Relation Age of Onset  . Diabetes Other        1st degree relative  . Hyperlipidemia Other   . Hypertension Other   . Heart disease Maternal Grandfather   . Heart disease Paternal Grandfather   . Heart disease Maternal Uncle        Review of Systems: Pertinent positive and negative review of systems were noted in the above HPI section. All other review of systems were otherwise negative.    Physical Exam: General: Well developed, well nourished, no acute distress Head: Normocephalic and atraumatic Eyes:  sclerae anicteric, EOMI Ears: Normal auditory acuity Mouth: No deformity or lesions Neck: Supple, no masses or thyromegaly Lungs: Clear throughout to auscultation Heart: Regular rate and rhythm; no murmurs, rubs or bruits Abdomen: Soft, non tender and non distended. No masses, hepatosplenomegaly or hernias noted. Normal Bowel sounds Rectal: Deferred to colonoscopy Musculoskeletal: Symmetrical with no gross deformities  Skin: No lesions on visible extremities Pulses:  Normal pulses noted Extremities: No clubbing,  cyanosis, edema or deformities noted Neurological: Alert oriented x 4, grossly nonfocal Cervical Nodes:  No significant cervical adenopathy Inguinal Nodes: No significant inguinal adenopathy Psychological:  Alert and cooperative. Normal mood and affect  Assessment and Recommendations:  1. CRC screening, average risk.  Recent left lower extremity DVT.  The current plan is that he will come off anticoagulants in 3-4 months and we will plan to proceed with screening a colonoscopy in  April after anticoagulants are discontinued.  If longer term anticoagulation is planned we will proceed with a 2-day hold of Xarelto for the colonoscopy in April and we will confer with his prescribing provider to determine the appropriate time for 2-day hold. The risks (including bleeding, perforation, infection, missed lesions, medication reactions and possible hospitalization or surgery if complications occur), benefits, and alternatives to colonoscopy with possible biopsy and possible polypectomy were discussed with the patient and they consent to proceed.    cc: Libby Maw, [address] [place], [place] [id number]

## 2017-12-24 ENCOUNTER — Encounter: Payer: Self-pay | Admitting: Gastroenterology

## 2018-02-15 ENCOUNTER — Telehealth: Payer: Self-pay

## 2018-02-15 ENCOUNTER — Telehealth: Payer: Self-pay | Admitting: Family Medicine

## 2018-02-15 DIAGNOSIS — I82402 Acute embolism and thrombosis of unspecified deep veins of left lower extremity: Secondary | ICD-10-CM

## 2018-02-15 MED ORDER — RIVAROXABAN 20 MG PO TABS: 20.0000 mg | ORAL_TABLET | Freq: Every day | ORAL | 3 refills | Status: DC

## 2018-02-15 NOTE — Telephone Encounter (Signed)
I called and left a voicemail for patient. I refilled patient's Xarelto. I advised patient that the refill request was not sent to our office, and may have been sent to Dr. Bebe Shaggy old office.

## 2018-02-15 NOTE — Telephone Encounter (Signed)
PT returned call,  Let pt know xarelto was filled. PT states he has a colonoscopy  Scheduled and he and the GI Dr doesn't want him on blood thinner when he has the procedure.  He would like a call back to discuss discontinuing xarelto or prolonging the colonoscopy.

## 2018-02-15 NOTE — Telephone Encounter (Signed)
Copied from [place] [id number]. Topic: Quick Communication - See Telephone Encounter >> Feb 15, 2018  3:08 PM Robina Ade, Helene Kelp D wrote: CRM for notification. See Telephone encounter for: 02/15/18. Patient called and said that Dr. Ethelene Hal denied his refill for his medication rivaroxaban (XARELTO) 20 MG TABS tablet and would like the reason for this. Please call patient back thanks,

## 2018-02-15 NOTE — Telephone Encounter (Signed)
Rx refilled, see other note.

## 2018-02-16 NOTE — Telephone Encounter (Signed)
Appointment made for 3/27 at 4pm.

## 2018-02-16 NOTE — Telephone Encounter (Signed)
Needs to see me

## 2018-02-17 ENCOUNTER — Encounter: Payer: Self-pay | Admitting: Family Medicine

## 2018-02-17 ENCOUNTER — Ambulatory Visit (INDEPENDENT_AMBULATORY_CARE_PROVIDER_SITE_OTHER): Payer: Managed Care, Other (non HMO) | Admitting: Family Medicine

## 2018-02-17 VITALS — BP 138/90 | HR 95 | Ht 72.0 in | Wt 201.0 lb

## 2018-02-17 DIAGNOSIS — F528 Other sexual dysfunction not due to a substance or known physiological condition: Secondary | ICD-10-CM

## 2018-02-17 DIAGNOSIS — I824Z2 Acute embolism and thrombosis of unspecified deep veins of left distal lower extremity: Secondary | ICD-10-CM

## 2018-02-17 DIAGNOSIS — I872 Venous insufficiency (chronic) (peripheral): Secondary | ICD-10-CM | POA: Diagnosis not present

## 2018-02-17 MED ORDER — SILDENAFIL CITRATE 20 MG PO TABS: ORAL_TABLET | ORAL | 1 refills | Status: DC

## 2018-02-17 NOTE — Patient Instructions (Signed)
Venous Thromboembolism Prevention Venous thromboembolism (VTE) is a condition in which a blood clot (thrombus) develops in the body. A thrombus usually occurs in a deep vein in the leg or the pelvis (DVT), but it can also occur in the arm. Sometimes, pieces of a thrombus can break off from its original place of development and travel through the bloodstream to other parts of the body. When that happens, the thrombus is called an embolus. An embolus that travels to one or both lungs is called a pulmonary embolism. An embolism can block the blood flow in the blood vessels of other organs as well. VTE is a serious health condition that can cause disability or death. It is very important to get help right away and to not ignore symptoms. How can a VTE be prevented?  Exercise regularly. Take a brisk 30 minute walk every day. Staying active and moving around can help you to prevent blood clots.  Avoid sitting or lying in bed for long periods of time without moving your legs. Change your position often, especially during long-distance travel (over 4 hours).  If you are a woman who is over 65 years of age, avoid unnecessary use of medicines that contain estrogen. These include birth control pills and hormone replacement therapy.  Do not smoke, especially if you take estrogen medicines. If you need help quitting, ask your health care provider.  Eat plenty of fruits and vegetables. Ask your health care provider or dietitian if there are foods that you should avoid.  Maintain a weight that is appropriate for your height. Ask your health care provider what weight is healthy for you.  Wear loose-fitting clothing. Avoid constrictive or tight clothing around your legs or waist.  Try not to bump or injure your legs. Avoid crossing your legs when you are sitting.  Do not use pillows under your knees while lying down unless told by your health care provider.  Wear support hose (compression stockings or TED  hose) as told by your health care provider Compression stockings increase blood flow in your legs and can help prevent blood clots. Do not let them bunch up when you are wearing them. How can I prevent VTE when I travel? Long-distance travel (over 4 hours) can increase the risk of a VTE. To prevent VTE when traveling:  Exercise your legs every hour by standing, stretching, and bending and straightening your legs. If you are traveling by airplane, train, or bus, walk up and down the aisle as often as possible to get your blood moving. If you are traveling by car, stop and get out of the car every hour to exercise your legs and stretch. Other types of exercise might include: ? Keeping your feet flat on the ground and raising your toes. ? Switching from tightening the muscles in your calves and thighs to relaxing those same muscles while you are sitting. ? Pointing and flexing your feet at the ankle joints while you are sitting.  Stay well hydrated while traveling. Drink enough water to keep your urine clear or pale yellow.  Avoid drinking alcohol during long travel.  Generally, it is not recommended that you take medicines to prevent DVT during routine travel. How can VTE be prevented if I am hospitalized? A VTE may be prevented by taking medicines that are prescribed to prevent blood clots (anticoagulants). You can also help to prevent VTE while in the hospital by taking these actions:  Get out of bed and walk. Ask your health care  provider if this is safe for you to do.  Request the use of a sequential compression device (SCD). This is a machine that pumps air into compression sleeves that are wrapped around your legs.  Request the use of compression stockings, which are tight, elastic stockings that apply pressure to the lower legs. Compression stockings are sometimes used with SCDs.  How can I prevent VTE after surgery? Understand that there is an increased risk for VTE for the first 4-6  weeks after surgery. During this time:  Avoid long-distance travel (over 4 hours). If you must travel during this time, ask your health care provider about additional preventive actions that you can take. These might include exercising your arms and legs every hour while you travel.  Avoid sitting or lying still for too long. If possible, get up and walk around one time every hour. Ask your health care provider when this is safe for you to do.  Get help right away if:  You have new or increased pain, swelling, or redness in an arm or leg.  You have numbness or tingling in an arm or leg.  You have shortness of breath while active or at rest.  You have chest pain.  You have a rapid or irregular heartbeat.  You feel light-headed or dizzy.  You cough up blood.  You notice blood in your vomit, bowel movement, or urine. These symptoms may represent a serious problem that is an emergency. Do not wait to see if the symptoms will go away. Get medical help right away. Call your local emergency services (911 in the [place] Do not drive yourself to the hospital. This information is not intended to replace advice given to you by your health care provider. Make sure you discuss any questions you have with your health care provider. Document Released: 10/29/2009 Document Revised: 04/17/2016 Document Reviewed: 03/07/2015 Elsevier Interactive Patient Education  [employer].

## 2018-02-17 NOTE — Progress Notes (Signed)
Subjective:  Patient ID: Leslie Harrington, male    DOB: Oct 30, 1956  Age: 62 y.o. MRN: [id number]  CC: Follow-up (discuss discontinuing Xarelto, patient has completed 3 months worth of treatment.)   HPI [name] presents for fu of his dvt. He has completed 3 mos of xarelto without issue. Leg has returned to its normal size and is no longer tender. He has a ho venous insufficiency of this leg but has never had a dvt prior to the Superficial dvt discovered 3 mos ago. No fh of dvt or pe. Low dose Ct of chest was denied. He is to have a colonoscopy later in April he will se me for a pe later this summer. He asks for an rx for ed. He has taken cialis in the past.   Outpatient Medications Prior to Visit  Medication Sig Dispense Refill  . rivaroxaban (XARELTO) 20 MG TABS tablet Take 1 tablet (20 mg total) by mouth daily with supper. 30 tablet 3   No facility-administered medications prior to visit.     ROS Review of Systems  Constitutional: Negative for chills, fatigue and fever.  Respiratory: Negative.   Cardiovascular: Negative.   Gastrointestinal: Negative.   Genitourinary: Negative for difficulty urinating, hematuria and urgency.  Musculoskeletal: Positive for myalgias.  Skin: Negative for color change.  Allergic/Immunologic: Negative for immunocompromised state.  Hematological: Does not bruise/bleed easily.  Psychiatric/Behavioral: Negative.     Objective:  BP 138/90 (BP Location: Left Arm, Patient Position: Sitting, Cuff Size: Normal)   Pulse 95   Ht 6' (1.829 m)   Wt 201 lb (91.2 kg)   SpO2 99%   BMI 27.26 kg/m   BP Readings from Last 3 Encounters:  02/17/18 138/90  12/23/17 128/80  11/05/17 128/80    Wt Readings from Last 3 Encounters:  02/17/18 201 lb (91.2 kg)  12/23/17 205 lb 8 oz (93.2 kg)  11/05/17 205 lb 8 oz (93.2 kg)    Physical Exam  Constitutional: He is oriented to person, place, and time. He appears well-developed and well-nourished. No  distress.  HENT:  Head: Normocephalic and atraumatic.  Right Ear: External ear normal.  Left Ear: External ear normal.  Eyes: Right eye exhibits no discharge. Left eye exhibits no discharge. No scleral icterus.  Neck: No JVD present. No tracheal deviation present.  Pulmonary/Chest: Effort normal. No stridor.  Musculoskeletal:       Left lower leg: He exhibits no tenderness, no bony tenderness, no edema and no deformity.       Legs: Neurological: He is alert and oriented to person, place, and time.  Skin: Skin is warm and dry. He is not diaphoretic.  Psychiatric: He has a normal mood and affect. His behavior is normal.    Lab Results  Component Value Date   WBC 7.9 10/28/2017   HGB 15.7 10/28/2017   HCT 45.2 10/28/2017   PLT 230 10/28/2017   GLUCOSE 89 10/28/2017   CHOL 184 08/08/2010   TRIG 67.0 08/08/2010   HDL 39.80 08/08/2010   LDLCALC 131 (H) 08/08/2010   ALT 9 (L) 10/28/2017   AST 18 10/28/2017   NA 135 10/28/2017   K 3.9 10/28/2017   CL 104 10/28/2017   CREATININE 0.83 10/28/2017   BUN 12 10/28/2017   CO2 24 10/28/2017   TSH 0.62 12/25/2011   PSA 2.00 08/08/2010   INR 1.00 10/28/2017    US Venous Img Lower Unilateral Left  Result Date: 10/28/2017 CLINICAL DATA:  62 year old male  with a history of pain and swelling EXAM: LEFT LOWER EXTREMITY VENOUS DOPPLER ULTRASOUND TECHNIQUE: Gray-scale sonography with graded compression, as well as color Doppler and duplex ultrasound were performed to evaluate the lower extremity deep venous systems from the level of the common femoral vein and including the common femoral, femoral, profunda femoral, popliteal and calf veins including the posterior tibial, peroneal and gastrocnemius veins when visible. The superficial great saphenous vein was also interrogated. Spectral Doppler was utilized to evaluate flow at rest and with distal augmentation maneuvers in the common femoral, femoral and popliteal veins. COMPARISON:  None. FINDINGS:  Contralateral Common Femoral Vein: Respiratory phasicity is normal and symmetric with the symptomatic side. No evidence of thrombus. Normal compressibility. Common Femoral Vein: No evidence of thrombus. Normal compressibility, respiratory phasicity and response to augmentation. Saphenofemoral Junction: No evidence of thrombus. Normal compressibility and flow on color Doppler imaging. Profunda Femoral Vein: No evidence of thrombus. Normal compressibility and flow on color Doppler imaging. Femoral Vein: No evidence of thrombus. Normal compressibility, respiratory phasicity and response to augmentation. Popliteal Vein: No evidence of thrombus. Normal compressibility, respiratory phasicity and response to augmentation. Calf Veins: Peroneal vein and posterior tibial vein patent. Noncompressible gastrocnemius vein. No extension into the popliteal vein. Superficial Great Saphenous Vein: Great saphenous vein thrombosed from the midthigh to the ankle. Other Findings:  None. IMPRESSION: Sonographic survey negative for DVT involving the left common femoral, femoral, popliteal vein. Study is positive for DVT involving the gastrocnemius vein. Superficial thrombophlebitis with thrombosis of the great saphenous vein from the midthigh to the ankle. Signed, Dulcy Fanny. Earleen [place], DO [facility] Electronically Signed   By: Corrie Mckusick D.O.   On: 10/28/2017 09:23    Assessment & Plan:   Leslie was seen today for follow-up.  Diagnoses and all orders for this visit:  Acute deep vein thrombosis (DVT) of distal vein of left lower extremity (HCC)  ERECTILE DYSFUNCTION -     sildenafil (REVATIO) 20 MG tablet; May take 1-5 pills 45 minutes prior daily as needed.  Venous insufficiency (chronic) (peripheral)   I am having Elmer Harrington. [name] start on sildenafil. I am also having him maintain his rivaroxaban.  Meds ordered this encounter  Medications  . sildenafil  (REVATIO) 20 MG tablet    Sig: May take 1-5 pills 45 minutes prior daily as needed.    Dispense:  50 tablet    Refill:  1     Follow-up: Return if symptoms worsen or fail to improve, for will return for cpe later this summer.Libby Maw, MD

## 2018-02-24 ENCOUNTER — Encounter: Payer: Self-pay | Admitting: Gastroenterology

## 2018-03-10 ENCOUNTER — Other Ambulatory Visit: Payer: Self-pay

## 2018-03-10 ENCOUNTER — Encounter: Payer: Self-pay | Admitting: Gastroenterology

## 2018-03-10 ENCOUNTER — Ambulatory Visit (AMBULATORY_SURGERY_CENTER): Payer: Managed Care, Other (non HMO) | Admitting: Gastroenterology

## 2018-03-10 VITALS — BP 152/81 | HR 56 | Temp 98.4°F | Resp 18 | Ht 72.0 in | Wt 205.0 lb

## 2018-03-10 DIAGNOSIS — Z1211 Encounter for screening for malignant neoplasm of colon: Secondary | ICD-10-CM | POA: Diagnosis not present

## 2018-03-10 DIAGNOSIS — Z1212 Encounter for screening for malignant neoplasm of rectum: Secondary | ICD-10-CM | POA: Diagnosis not present

## 2018-03-10 DIAGNOSIS — K635 Polyp of colon: Secondary | ICD-10-CM | POA: Diagnosis not present

## 2018-03-10 DIAGNOSIS — D122 Benign neoplasm of ascending colon: Secondary | ICD-10-CM

## 2018-03-10 DIAGNOSIS — D123 Benign neoplasm of transverse colon: Secondary | ICD-10-CM

## 2018-03-10 DIAGNOSIS — D125 Benign neoplasm of sigmoid colon: Secondary | ICD-10-CM | POA: Diagnosis not present

## 2018-03-10 MED ORDER — SODIUM CHLORIDE 0.9 % IV SOLN: 500.0000 mL | Freq: Once | INTRAVENOUS | Status: DC

## 2018-03-10 NOTE — Progress Notes (Signed)
Pt's states no medical or surgical changes since previsit or office visit. 

## 2018-03-10 NOTE — Patient Instructions (Signed)
Impression/recommendations:  Polyps (handout given)  YOU HAD AN ENDOSCOPIC PROCEDURE TODAY AT THE [facility]:   Refer to the procedure report that was given to you for any specific questions about what was found during the examination.  If the procedure report does not answer your questions, please call your gastroenterologist to clarify.  If you requested that your care partner not be given the details of your procedure findings, then the procedure report has been included in a sealed envelope for you to review at your convenience later.  YOU SHOULD EXPECT: Some feelings of bloating in the abdomen. Passage of more gas than usual.  Walking can help get rid of the air that was put into your GI tract during the procedure and reduce the bloating. If you had a lower endoscopy (such as a colonoscopy or flexible sigmoidoscopy) you may notice spotting of blood in your stool or on the toilet paper. If you underwent a bowel prep for your procedure, you may not have a normal bowel movement for a few days.  Please Note:  You might notice some irritation and congestion in your nose or some drainage.  This is from the oxygen used during your procedure.  There is no need for concern and it should clear up in a day or so.  SYMPTOMS TO REPORT IMMEDIATELY:   Following lower endoscopy (colonoscopy or flexible sigmoidoscopy):  Excessive amounts of blood in the stool  Significant tenderness or worsening of abdominal pains  Swelling of the abdomen that is new, acute  Fever of 100F or higher  For urgent or emergent issues, a gastroenterologist can be reached at any hour by calling [phone].   DIET:  We do recommend a small meal at first, but then you may proceed to your regular diet.  Drink plenty of fluids but you should avoid alcoholic beverages for 24 hours.  ACTIVITY:  You should plan to take it easy for the rest of today and you should NOT DRIVE or use heavy machinery until tomorrow  (because of the sedation medicines used during the test).    FOLLOW UP: Our staff will call the number listed on your records the next business day following your procedure to check on you and address any questions or concerns that you may have regarding the information given to you following your procedure. If we do not reach you, we will leave a message.  However, if you are feeling well and you are not experiencing any problems, there is no need to return our call.  We will assume that you have returned to your regular daily activities without incident.  If any biopsies were taken you will be contacted by phone or by letter within the next 1-3 weeks.  Please call us at [phone] if you have not heard about the biopsies in 3 weeks.    SIGNATURES/CONFIDENTIALITY: You and/or your care partner have signed paperwork which will be entered into your electronic medical record.  These signatures attest to the fact that that the information above on your After Visit Summary has been reviewed and is understood.  Full responsibility of the confidentiality of this discharge information lies with you and/or your care-partner. 

## 2018-03-10 NOTE — Op Note (Signed)
[facility] Patient Name: Leslie Harrington Procedure Date: 03/10/2018 8:04 AM MRN: [id number] Endoscopist: Ladene Artist , MD Age: 62 Referring MD:  Date of Birth: 03-12-1956 Gender: Male Account #: [id number] Procedure:                Colonoscopy Indications:              Screening for colorectal malignant neoplasm Medicines:                Monitored Anesthesia Care Procedure:                Pre-Anesthesia Assessment:                           - Prior to the procedure, a History and Physical                            was performed, and patient medications and                            allergies were reviewed. The patient's tolerance of                            previous anesthesia was also reviewed. The risks                            and benefits of the procedure and the sedation                            options and risks were discussed with the patient.                            All questions were answered, and informed consent                            was obtained. Prior Anticoagulants: The patient has                            taken no previous anticoagulant or antiplatelet                            agents. ASA Grade Assessment: II - A patient with                            mild systemic disease. After reviewing the risks                            and benefits, the patient was deemed in                            satisfactory condition to undergo the procedure.                           After obtaining informed consent, the colonoscope  was passed under direct vision. Throughout the                            procedure, the patient's blood pressure, pulse, and                            oxygen saturations were monitored continuously. The                            Colonoscope was introduced through the anus and                            advanced to the the cecum, identified by                            appendiceal orifice and  ileocecal valve. The                            ileocecal valve, appendiceal orifice, and rectum                            were photographed. The quality of the bowel                            preparation was excellent. The colonoscopy was                            performed without difficulty. The patient tolerated                            the procedure well. Scope In: 8:14:40 AM Scope Out: 8:39:03 AM Scope Withdrawal Time: 0 hours 20 minutes 38 seconds  Total Procedure Duration: 0 hours 24 minutes 23 seconds  Findings:                 The perianal and digital rectal examinations were                            normal.                           Nine sessile polyps were found in the sigmoid colon                            (2), transverse colon (2), hepatic flexure (2) and                            ascending colon (2). The polyps were 5 to 8 mm in                            size. These polyps were removed with a cold snare.                            Resection and retrieval were complete.  The exam was otherwise without abnormality on                            direct and retroflexion views. Complications:            No immediate complications. Estimated blood loss:                            None. Estimated Blood Loss:     Estimated blood loss: none. Impression:               - Nine 5 to 8 mm polyps in the sigmoid colon, in                            the transverse colon, at the hepatic flexure and in                            the ascending colon, removed with a cold snare.                            Resected and retrieved.                           - The examination was otherwise normal on direct                            and retroflexion views. Recommendation:           - Repeat colonoscopy in 3 years for surveillance,                            pending pathology review.                           - Patient has a contact number available for                             emergencies. The signs and symptoms of potential                            delayed complications were discussed with the                            patient. Return to normal activities tomorrow.                            Written discharge instructions were provided to the                            patient.                           - Resume previous diet.                           - Continue present medications.                           -  Await pathology results. Ladene Artist, MD 03/10/2018 8:42:13 AM This report has been signed electronically.

## 2018-03-10 NOTE — Progress Notes (Signed)
Report given to PACU, vss 

## 2018-03-10 NOTE — Progress Notes (Signed)
Called to room to assist during endoscopic procedure.  Patient ID and intended procedure confirmed with present staff. Received instructions for my participation in the procedure from the performing physician.  

## 2018-03-11 ENCOUNTER — Telehealth: Payer: Self-pay

## 2018-03-11 NOTE — Telephone Encounter (Signed)
  Follow up Call-  Call back number 03/10/2018  Post procedure Call Back phone  # [phone]  Permission to leave phone message Yes  Some recent data might be hidden     Patient questions:  Do you have a fever, pain , or abdominal swelling? No. Pain Score  0 *  Have you tolerated food without any problems? Yes.    Have you been able to return to your normal activities? Yes.    Do you have any questions about your discharge instructions: Diet   No. Medications  No. Follow up visit  No.  Do you have questions or concerns about your Care? No.  Actions: * If pain score is 4 or above: No action needed, pain <4.

## 2018-03-16 ENCOUNTER — Encounter: Payer: Self-pay | Admitting: Gastroenterology

## 2019-03-16 ENCOUNTER — Telehealth: Payer: Self-pay | Admitting: Family Medicine

## 2019-03-16 NOTE — Telephone Encounter (Signed)
Called and talked with patient. He states that he does not need to schedule a virtual visit with Dr. Ethelene Hal at this time.

## 2019-12-05 ENCOUNTER — Other Ambulatory Visit: Payer: Self-pay

## 2019-12-06 ENCOUNTER — Encounter: Payer: Self-pay | Admitting: Family Medicine

## 2019-12-06 ENCOUNTER — Ambulatory Visit (INDEPENDENT_AMBULATORY_CARE_PROVIDER_SITE_OTHER): Payer: Managed Care, Other (non HMO) | Admitting: Family Medicine

## 2019-12-06 VITALS — BP 118/84 | HR 77 | Temp 98.1°F | Ht 72.0 in | Wt 197.4 lb

## 2019-12-06 DIAGNOSIS — Z23 Encounter for immunization: Secondary | ICD-10-CM | POA: Diagnosis not present

## 2019-12-06 DIAGNOSIS — Z720 Tobacco use: Secondary | ICD-10-CM | POA: Diagnosis not present

## 2019-12-06 DIAGNOSIS — R3911 Hesitancy of micturition: Secondary | ICD-10-CM

## 2019-12-06 DIAGNOSIS — I82402 Acute embolism and thrombosis of unspecified deep veins of left lower extremity: Secondary | ICD-10-CM

## 2019-12-06 DIAGNOSIS — I872 Venous insufficiency (chronic) (peripheral): Secondary | ICD-10-CM | POA: Diagnosis not present

## 2019-12-06 DIAGNOSIS — N401 Enlarged prostate with lower urinary tract symptoms: Secondary | ICD-10-CM

## 2019-12-06 MED ORDER — RIVAROXABAN (XARELTO) VTE STARTER PACK (15 & 20 MG): ORAL_TABLET | ORAL | 0 refills | Status: DC

## 2019-12-06 MED ORDER — TAMSULOSIN HCL 0.4 MG PO CAPS: 0.4000 mg | ORAL_CAPSULE | Freq: Every day | ORAL | 2 refills | Status: DC

## 2019-12-06 NOTE — Patient Instructions (Signed)
Venous Thromboembolism Prevention Venous thromboembolism (VTE) is a condition in which a blood clot (thrombus) develops in the body. A deep vein thrombosis (DVT) is a blood clot that usually occurs in a deep vein in the leg or the pelvis, but it can also occur in the arm. Sometimes, pieces of a thrombus can break off and travel through the bloodstream to other parts of the body. When that happens, the thrombus is called an embolus. An embolus that travels to the lungs is called a pulmonary embolism. An embolism can block the blood flow in the blood vessels of other organs as well. How can this condition affect me? VTE is a serious health condition that can cause disability or death. It is very important to get help right away. Do not ignore your symptoms. What can increase my risk? You are more likely to develop this condition if you:  Have had recent major surgery or trauma.  Have certain health conditions, such as cancer.  Are in the hospital for a sudden illness.  Have not moved for a long period of time, such as if you are on bed rest or traveling a long distance.  Take certain medicines, such as birth control pills or hormone replacement therapy.  Are pregnant or recently gave birth.  Have a personal or family history of VTE.  Are overweight.  Are 62 years of age or older.  Use products that contain nicotine and tobacco. What actions can I take to prevent this? In the hospital A VTE may be prevented by taking medicines that are prescribed to prevent blood clots (anticoagulants). You can also help to prevent VTE while in the hospital by taking these actions:  Get out of bed and walk. Ask your health care provider if this is safe for you to do.  Ask your health care provider if you should use a sequential compression device (SCD). This is a machine that pumps air into compression sleeves that are wrapped around your legs.  Ask your health care provider if you should wear tight,  elastic stockings that apply pressure to the lower legs (compression stockings). Compression stockings are sometimes used with SCDs. At home after surgery Understand that you have an increased risk for VTE for the first 4-6 weeks after surgery. During this time:  Avoid traveling for more than 4 hours. If you must travel after surgery, ask your health care provider about additional preventive actions that you can take.  Avoid sitting or lying still for too long. If possible, get up and walk around one time every hour. Ask your health care provider when this is safe for you to do. While traveling Travel that takes more than 4 hours can increase the risk of a VTE. To prevent VTE when traveling:  Exercise your arms and legs every hour. You can do this by standing, stretching, and bending and straightening your arms and legs. If you are traveling by airplane, train, or bus, walk up and down the aisle as often as possible to get your blood moving. If you are traveling by car, stop and get out of the car every hour to exercise your arms and legs and stretch. Other types of exercise might include: ? Keeping your feet flat on the ground and raising your toes. ? Switching from tightening the muscles in your calves and thighs to relaxing those same muscles while you are sitting. ? Pointing and flexing your feet at the ankle joints while you are sitting.  Drink enough  water to keep your urine pale yellow.  Avoid drinking alcohol during long travel. Generally, it is not recommended that you take medicines to prevent DVT during routine travel. Activity   Stay active. Avoid sitting or lying in bed for long periods of time without moving your arms and legs.  Exercise by moving your arms and legs for 30 minutes or more every day.  Try not to bump or injure your legs.  Avoid crossing your legs when you are sitting. Lifestyle  Do not use any products that contain nicotine or tobacco, such as cigarettes,  e-cigarettes, and chewing tobacco. If you need help quitting, ask your health care provider. This is especially important if you take estrogen medicines.  Avoid wearing tight clothing around your legs or waist. General information   Wear compression stockings as told by your health care provider. These stockings help to prevent blood clots and reduce swelling in your legs. Do not let them bunch up when you are wearing them.  Avoid using medicines that contain estrogen if you do not need them. These include birth control pills and hormone replacement therapy.  Do not use pillows under your knees while lying down unless told by your health care provider.  Maintain a healthy weight. Where to find more information  Centers for Disease Control and Prevention: http://www.wolf.info/  American Heart Association: www.heart.org Get help right away if:  You have chest pain or shortness of breath.  You cough up blood.  You have a rapid or irregular heartbeat.  You feel light-headed or dizzy.  You have new or increased pain, swelling, or redness in an arm or leg.  You have numbness or tingling in an arm or leg.  You have blood in your vomit, stool, or urine. These symptoms may represent a serious problem that is an emergency. Do not wait to see if the symptoms will go away. Get medical help right away. Call your local emergency services (911 in the [place] Do not drive yourself to the hospital. Summary  Venous thromboembolism (VTE) is a condition in which a blood clot (thrombus)develops in the body. A deep vein thrombosis (DVT) is a blood clot that usually occurs in a deep vein in the leg or the pelvis, but it can also occur in the arm.  VTE is a serious health condition that can cause disability or death. It is very important to get help right away. Do not ignore your symptoms.  If you are traveling, exercise your arms and legs every hour.  Stay active to help prevent blood clots. Avoid sitting or  lying in bed for long periods of time without moving your arms and legs. This information is not intended to replace advice given to you by your health care provider. Make sure you discuss any questions you have with your health care provider. Document Revised: 03/01/2019 Document Reviewed: 01/18/2019 Elsevier Patient Education  [place].  Deep Vein Thrombosis  Deep vein thrombosis (DVT) is a condition in which a blood clot forms in a deep vein, such as a lower leg, thigh, or arm vein. A clot is blood that has thickened into a gel or solid. This condition is dangerous. It can lead to serious and even life-threatening complications if the clot travels to the lungs and causes a blockage (pulmonary embolism). It can also damage veins in the leg. This can result in leg pain, swelling, discoloration, and sores (post-thrombotic syndrome). What are the causes? This condition may be caused by:  A slowdown of blood flow.  Damage to a vein.  A condition that causes blood to clot more easily, such as an inherited clotting disorder. What increases the risk? The following factors may make you more likely to develop this condition:  Being overweight.  Being older, especially over age 87.  Sitting or lying down for more than four hours.  Being in the hospital.  Lack of physical activity (sedentary lifestyle).  Pregnancy, being in childbirth, or having recently given birth.  Taking medicines that contain estrogen, such as medicines to prevent pregnancy.  Smoking.  A history of any of the following: ? Blood clots or a blood clotting disease. ? Peripheral vascular disease. ? Inflammatory bowel disease. ? Cancer. ? Heart disease. ? Genetic conditions that affect how your blood clots, such as Factor V Leiden mutation. ? Neurological diseases that affect your legs (leg paresis). ? A recent injury, such as a car accident. ? Major or lengthy surgery. ? A central line placed inside a  large vein. What are the signs or symptoms? Symptoms of this condition include:  Swelling, pain, or tenderness in an arm or leg.  Warmth, redness, or discoloration in an arm or leg. If the clot is in your leg, symptoms may be more noticeable or worse when you stand or walk. Some people may not develop any symptoms. How is this diagnosed? This condition is diagnosed with:  A medical history and physical exam.  Tests, such as: ? Blood tests. These are done to check how well your blood clots. ? Ultrasound. This is done to check for clots. ? Venogram. For this test, contrast dye is injected into a vein and X-rays are taken to check for any clots. How is this treated? Treatment for this condition depends on:  The cause of your DVT.  Your risk for bleeding or developing more clots.  Any other medical conditions that you have. Treatment may include:  Taking a blood thinner (anticoagulant). This type of medicine prevents clots from forming. It may be taken by mouth, injected under the skin, or injected through an IV (catheter).  Injecting clot-dissolving medicines into the affected vein (catheter-directed thrombolysis).  Having surgery. Surgery may be done to: ? Remove the clot. ? Place a filter in a large vein to catch blood clots before they reach the lungs. Some treatments may be continued for up to six months. Follow these instructions at home: If you are taking blood thinners:  Take the medicine exactly as told by your health care provider. Some blood thinners need to be taken at the same time every day. Do not skip a dose.  Talk with your health care provider before you take any medicines that contain aspirin or NSAIDs. These medicines increase your risk for dangerous bleeding.  Ask your health care provider about foods and drugs that could change the way the medicine works (may interact). Avoid those things if your health care provider tells you to do so.  Blood thinners  can cause easy bruising and may make it difficult to stop bleeding. Because of this: ? Be very careful when using knives, scissors, or other sharp objects. ? Use an electric razor instead of a blade. ? Avoid activities that could cause injury or bruising, and follow instructions about how to prevent falls.  Wear a medical alert bracelet or carry a card that lists what medicines you take. General instructions  Take over-the-counter and prescription medicines only as told by your health care provider.  Return  to your normal activities as told by your health care provider. Ask your health care provider what activities are safe for you.  Wear compression stockings if recommended by your health care provider.  Keep all follow-up visits as told by your health care provider. This is important. How is this prevented? To lower your risk of developing this condition again:  For 30 or more minutes every day, do an activity that: ? Involves moving your arms and legs. ? Increases your heart rate.  When traveling for longer than four hours: ? Exercise your arms and legs every hour. ? Drink plenty of water. ? Avoid drinking alcohol.  Avoid sitting or lying for a long time without moving your legs.  If you have surgery or you are hospitalized, ask about ways to prevent blood clots. These may include taking frequent walks or using anticoagulants.  Stay at a healthy weight.  If you are a woman who is older than age 20, avoid unnecessary use of medicines that contain estrogen, such as some birth control pills.  Do not use any products that contain nicotine or tobacco, such as cigarettes and e-cigarettes. This is especially important if you take estrogen medicines. If you need help quitting, ask your health care provider. Contact a health care provider if:  You miss a dose of your blood thinner.  Your menstrual period is heavier than usual.  You have unusual bruising. Get help right away  if:  You have: ? New or increased pain, swelling, or redness in an arm or leg. ? Numbness or tingling in an arm or leg. ? Shortness of breath. ? Chest pain. ? A rapid or irregular heartbeat. ? A severe headache or confusion. ? A cut that will not stop bleeding.  There is blood in your vomit, stool, or urine.  You have a serious fall or accident, or you hit your head.  You feel light-headed or dizzy.  You cough up blood. These symptoms may represent a serious problem that is an emergency. Do not wait to see if the symptoms will go away. Get medical help right away. Call your local emergency services (911 in the [place] Do not drive yourself to the hospital. Summary  Deep vein thrombosis (DVT) is a condition in which a blood clot forms in a deep vein, such as a lower leg, thigh, or arm vein.  Symptoms can include swelling, warmth, pain, and redness in your leg or arm.  This condition may be treated with a blood thinner (anticoagulant medicine), medicine that is injected to dissolve blood clots,compression stockings, or surgery.  If you are prescribed blood thinners, take them exactly as told. This information is not intended to replace advice given to you by your health care provider. Make sure you discuss any questions you have with your health care provider. Document Revised: 10/23/2017 Document Reviewed: 04/10/2017 Elsevier Patient Education  2020 [employer].  Steps to Quit Smoking Smoking tobacco is the leading cause of preventable death. It can affect almost every organ in the body. Smoking puts you and those around you at risk for developing many serious chronic diseases. Quitting smoking can be difficult, but it is one of the best things that you can do for your health. It is never too late to quit. How do I get ready to quit? When you decide to quit smoking, create a plan to help you succeed. Before you quit:  Pick a date to quit. Set a date within the next 2 weeks to  give you time to prepare.  Write down the reasons why you are quitting. Keep this list in places where you will see it often.  Tell your family, friends, and co-workers that you are quitting. Support from your loved ones can make quitting easier.  Talk with your health care provider about your options for quitting smoking.  Find out what treatment options are covered by your health insurance.  Identify people, places, things, and activities that make you want to smoke (triggers). Avoid them. What first steps can I take to quit smoking?  Throw away all cigarettes at home, at work, and in your car.  Throw away smoking accessories, such as Scientist, research (medical).  Clean your car. Make sure to empty the ashtray.  Clean your home, including curtains and carpets. What strategies can I use to quit smoking? Talk with your health care provider about combining strategies, such as taking medicines while you are also receiving in-person counseling. Using these two strategies together makes you more likely to succeed in quitting than if you used either strategy on its own.  If you are pregnant or breastfeeding, talk with your health care provider about finding counseling or other support strategies to quit smoking. Do not take medicine to help you quit smoking unless your health care provider tells you to do so. To quit smoking: Quit right away  Quit smoking completely, instead of gradually reducing how much you smoke over a period of time. Research shows that stopping smoking right away is more successful than gradually quitting.  Attend in-person counseling to help you build problem-solving skills. You are more likely to succeed in quitting if you attend counseling sessions regularly. Even short sessions of 10 minutes can be effective. Take medicine You may take medicines to help you quit smoking. Some medicines require a prescription and some you can purchase over-the-counter. Medicines may have  nicotine in them to replace the nicotine in cigarettes. Medicines may:  Help to stop cravings.  Help to relieve withdrawal symptoms. Your health care provider may recommend:  Nicotine patches, gum, or lozenges.  Nicotine inhalers or sprays.  Non-nicotine medicine that is taken by mouth. Find resources Find resources and support systems that can help you to quit smoking and remain smoke-free after you quit. These resources are most helpful when you use them often. They include:  Online chats with a Social worker.  Telephone quitlines.  Printed Furniture conservator/restorer.  Support groups or group counseling.  Text messaging programs.  Mobile phone apps or applications. Use apps that can help you stick to your quit plan by providing reminders, tips, and encouragement. There are many free apps for mobile devices as well as websites. Examples include Quit Guide from the [employer] and smokefree.gov What things can I do to make it easier to quit?   Reach out to your family and friends for support and encouragement. Call telephone quitlines (1-800-QUIT-NOW), reach out to support groups, or work with a counselor for support.  Ask people who smoke to avoid smoking around you.  Avoid places that trigger you to smoke, such as bars, parties, or smoke-break areas at work.  Spend time with people who do not smoke.  Lessen the stress in your life. Stress can be a smoking trigger for some people. To lessen stress, try: ? Exercising regularly. ? Doing deep-breathing exercises. ? Doing yoga. ? Meditating. ? Performing a body scan. This involves closing your eyes, scanning your body from head to toe, and noticing which parts of your  body are particularly tense. Try to relax the muscles in those areas. How will I feel when I quit smoking? Day 1 to 3 weeks Within the first 24 hours of quitting smoking, you may start to feel withdrawal symptoms. These symptoms are usually most noticeable 2-3 days after  quitting, but they usually do not last for more than 2-3 weeks. You may experience these symptoms:  Mood swings.  Restlessness, anxiety, or irritability.  Trouble concentrating.  Dizziness.  Strong cravings for sugary foods and nicotine.  Mild weight gain.  Constipation.  Nausea.  Coughing or a sore throat.  Changes in how the medicines that you take for unrelated issues work in your body.  Depression.  Trouble sleeping (insomnia). Week 3 and afterward After the first 2-3 weeks of quitting, you may start to notice more positive results, such as:  Improved sense of smell and taste.  Decreased coughing and sore throat.  Slower heart rate.  Lower blood pressure.  Clearer skin.  The ability to breathe more easily.  Fewer sick days. Quitting smoking can be very challenging. Do not get discouraged if you are not successful the first time. Some people need to make many attempts to quit before they achieve long-term success. Do your best to stick to your quit plan, and talk with your health care provider if you have any questions or concerns. Summary  Smoking tobacco is the leading cause of preventable death. Quitting smoking is one of the best things that you can do for your health.  When you decide to quit smoking, create a plan to help you succeed.  Quit smoking right away, not slowly over a period of time.  When you start quitting, seek help from your health care provider, family, or friends. This information is not intended to replace advice given to you by your health care provider. Make sure you discuss any questions you have with your health care provider. Document Revised: 08/05/2019 Document Reviewed: 01/29/2019 Elsevier Patient Education  [place].

## 2019-12-06 NOTE — Progress Notes (Addendum)
Established Patient Office Visit  Subjective:  Patient ID: Leslie Harrington, male    DOB: 15-Jun-1956  Age: 64 y.o. MRN: [id number]  CC:  Chief Complaint  Patient presents with  . Leg Problem    started x Friday, Left leg is swollen with sharp pain, no known injury     HPI Leslie Harrington presents for evaluation and treatment of a 4-day history of swelling discomfort in his left lower extremity.  There was no injury.  Denies prolonged sitting or recent splinting.  History of DVT.  No family history of DVT.  Patient does smoke.  He does stand on his job.  Denies chest pain cough or shortness of breath.  He has had no fevers chills or difficulty breathing.  Is ready to try something for a week urine stream.  Past Medical History:  Diagnosis Date  . Acute bronchitis 09/17/2007  . ALLERGIC RHINITIS 05/10/2010  . ANXIETY DEPRESSION 08/08/2010  . ASTHMA, CHILDHOOD 09/17/2007  . CONJUNCTIVITIS, ACUTE, BILATERAL 05/10/2010  . ERECTILE DYSFUNCTION 09/17/2007  . HYPERLIPIDEMIA 08/08/2010  . OLECRANON BURSITIS, LEFT 08/08/2010  . SINUSITIS- ACUTE-NOS 02/07/2009  . Unspecified hearing loss 02/07/2009  . Wheezing 01/04/2010    Past Surgical History:  Procedure Laterality Date  . WISDOM TOOTH EXTRACTION     As a teen    Family History  Problem Relation Age of Onset  . Diabetes Other        1st degree relative  . Hyperlipidemia Other   . Hypertension Other   . Heart disease Maternal Grandfather   . Heart disease Paternal Grandfather   . Heart disease Maternal Uncle   . Colon cancer Neg Hx     Social History   Socioeconomic History  . Marital status: Significant Other    Spouse name: Not on file  . Number of children: 2  . Years of education: Not on file  . Highest education level: Not on file  Occupational History  . Not on file  Tobacco Use  . Smoking status: Current Every Day Smoker    Packs/day: 1.00    Years: 40.00    Pack years: 40.00    Types: Cigarettes  . Smokeless  tobacco: Never Used  Substance and Sexual Activity  . Alcohol use: Yes    Comment: occ beer  . Drug use: No  . Sexual activity: Not Currently  Other Topics Concern  . Not on file  Social History Narrative  . Not on file   Social Determinants of Health   Financial Resource Strain:   . Difficulty of Paying Living Expenses: Not on file  Food Insecurity:   . Worried About Charity fundraiser in the Last Year: Not on file  . Ran Out of Food in the Last Year: Not on file  Transportation Needs:   . Lack of Transportation (Medical): Not on file  . Lack of Transportation (Non-Medical): Not on file  Physical Activity:   . Days of Exercise per Week: Not on file  . Minutes of Exercise per Session: Not on file  Stress:   . Feeling of Stress : Not on file  Social Connections:   . Frequency of Communication with Friends and Family: Not on file  . Frequency of Social Gatherings with Friends and Family: Not on file  . Attends Religious Services: Not on file  . Active Member of Clubs or Organizations: Not on file  . Attends Archivist Meetings: Not on file  . Marital  Status: Not on file  Intimate Partner Violence:   . Fear of Current or Ex-Partner: Not on file  . Emotionally Abused: Not on file  . Physically Abused: Not on file  . Sexually Abused: Not on file    Outpatient Medications Prior to Visit  Medication Sig Dispense Refill  . sildenafil (REVATIO) 20 MG tablet May take 1-5 pills 45 minutes prior daily as needed. 50 tablet 1   Facility-Administered Medications Prior to Visit  Medication Dose Route Frequency Provider Last Rate Last Admin  . 0.9 %  sodium chloride infusion  500 mL Intravenous Once Ladene Artist, MD        No Known Allergies  ROS Review of Systems  Constitutional: Negative.   HENT: Negative.   Eyes: Negative for photophobia and visual disturbance.  Respiratory: Negative for chest tightness, shortness of breath and wheezing.   Cardiovascular:  Positive for leg swelling. Negative for chest pain.  Gastrointestinal: Negative for abdominal pain, nausea and vomiting.  Genitourinary: Positive for difficulty urinating and frequency.  Musculoskeletal: Positive for gait problem and myalgias. Negative for joint swelling.  Skin: Negative for rash and wound.  Allergic/Immunologic: Negative for immunocompromised state.  Neurological: Negative for speech difficulty, light-headedness and headaches.  Hematological: Does not bruise/bleed easily.  Psychiatric/Behavioral: Negative.       Objective:    Physical Exam  Constitutional: He is oriented to person, place, and time. He appears well-developed and well-nourished. No distress.  HENT:  Head: Normocephalic and atraumatic.  Right Ear: External ear normal.  Left Ear: External ear normal.  Eyes: Conjunctivae are normal. Right eye exhibits no discharge. Left eye exhibits no discharge. Scleral icterus is present.  Neck: No JVD present. No tracheal deviation present.  Cardiovascular: Normal rate, regular rhythm and normal heart sounds.  Pulses:      Dorsalis pedis pulses are 2+ on the right side and 1+ on the left side.       Posterior tibial pulses are 2+ on the right side and 1+ on the left side.  Pulmonary/Chest: Effort normal and breath sounds normal. No stridor.  Musculoskeletal:     Left lower leg: Swelling and tenderness present.       Legs:  Neurological: He is alert and oriented to person, place, and time.  Skin: Skin is warm and dry. He is not diaphoretic.  Psychiatric: He has a normal mood and affect. His behavior is normal.    BP 118/84   Pulse 77   Temp 98.1 F (36.7 C)   Ht 6' (1.829 m)   Wt 197 lb 6.4 oz (89.5 kg)   SpO2 99%   BMI 26.77 kg/m  Wt Readings from Last 3 Encounters:  12/06/19 197 lb 6.4 oz (89.5 kg)  03/10/18 205 lb (93 kg)  02/17/18 201 lb (91.2 kg)     Health Maintenance Due  Topic Date Due  . Hepatitis C Screening  04[birth year]  . HIV Screening   11/21/1973  . TETANUS/TDAP  11/21/1977    There are no preventive care reminders to display for this patient.  Lab Results  Component Value Date   TSH 0.62 12/25/2011   Lab Results  Component Value Date   WBC 7.9 10/28/2017   HGB 15.7 10/28/2017   HCT 45.2 10/28/2017   MCV 82.3 10/28/2017   PLT 230 10/28/2017   Lab Results  Component Value Date   NA 135 10/28/2017   K 3.9 10/28/2017   CO2 24 10/28/2017   GLUCOSE 89 10/28/2017  BUN 12 10/28/2017   CREATININE 0.83 10/28/2017   BILITOT 0.7 10/28/2017   ALKPHOS 73 10/28/2017   AST 18 10/28/2017   ALT 9 (L) 10/28/2017   PROT 7.2 10/28/2017   ALBUMIN 3.9 10/28/2017   CALCIUM 9.0 10/28/2017   ANIONGAP 7 10/28/2017   GFR 109.01 12/25/2011   Lab Results  Component Value Date   CHOL 184 08/08/2010   Lab Results  Component Value Date   HDL 39.80 08/08/2010   Lab Results  Component Value Date   LDLCALC 131 (H) 08/08/2010   Lab Results  Component Value Date   TRIG 67.0 08/08/2010   Lab Results  Component Value Date   CHOLHDL 5 08/08/2010   No results found for: HGBA1C    Assessment & Plan:   Problem List Items Addressed This Visit      Cardiovascular and Mediastinum   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)   Relevant Medications   Rivaroxaban 15 & 20 MG TBPK   Other Relevant Orders   VAS [place] LOWER EXTREMITY VENOUS (DVT) (Completed)   Antiphospholipid Syndrome Diagnostic Panel   Protein C, total and functional panel   D-Dimer, Quantitative (Completed)   Ambulatory referral to Hematology   Venous insufficiency (chronic) (peripheral)   Relevant Medications   Rivaroxaban 15 & 20 MG TBPK     Genitourinary   Benign prostatic hyperplasia with urinary hesitancy   Relevant Medications   tamsulosin (FLOMAX) 0.4 MG CAPS capsule     Other   Need for influenza vaccination - Primary   Relevant Orders   Flu Vaccine QUAD 6+ mos PF IM (Fluarix Quad PF) (Completed)   Tobacco use       Meds ordered this encounter  Medications  . tamsulosin (FLOMAX) 0.4 MG CAPS capsule    Sig: Take 1 capsule (0.4 mg total) by mouth daily.    Dispense:  30 capsule    Refill:  2  . Rivaroxaban 15 & 20 MG TBPK    Sig: Follow package directions: Take one 15mg  tablet by mouth twice a day. On day 22, switch to one 20mg  tablet once a day. Take with food.    Dispense:  51 each    Refill:  0    Follow-up: Return in about 1 week (around 12/13/2019).  Patient was given information on prevention of venous thromboembolism as well as DVT.  He was also given information on steps to quit smoking.  Advised him that he was overdue for a physical.  He will go to the emergency room immediately with any shortness of breath chest pain or cough.  Otherwise follow-up in 1 week for recheck.  Libby Maw, MD

## 2019-12-07 ENCOUNTER — Other Ambulatory Visit: Payer: Self-pay

## 2019-12-07 ENCOUNTER — Ambulatory Visit (HOSPITAL_COMMUNITY)
Admission: RE | Admit: 2019-12-07 | Discharge: 2019-12-07 | Disposition: A | Discharge status: Home / Self Care | Payer: Managed Care, Other (non HMO) | Source: Ambulatory Visit | Attending: Family Medicine | Admitting: Family Medicine

## 2019-12-07 DIAGNOSIS — I82402 Acute embolism and thrombosis of unspecified deep veins of left lower extremity: Secondary | ICD-10-CM

## 2019-12-07 NOTE — Progress Notes (Signed)
Left lower extremity venous duplex completed.  Preliminary results can be found under CV proc under chart review.  12/07/2019 11:28 AM  Melodee Lupe, K., RDMS, RVT

## 2019-12-07 NOTE — Addendum Note (Signed)
Addended by: Jon Billings on: 12/07/2019 11:44 AM   Modules accepted: Orders

## 2019-12-08 ENCOUNTER — Telehealth: Payer: Self-pay | Admitting: Family Medicine

## 2019-12-08 DIAGNOSIS — I82402 Acute embolism and thrombosis of unspecified deep veins of left lower extremity: Secondary | ICD-10-CM

## 2019-12-08 MED ORDER — WARFARIN SODIUM 5 MG PO TABS: ORAL_TABLET | ORAL | 3 refills | Status: DC

## 2019-12-08 MED ORDER — APIXABAN 5 MG PO TABS: ORAL_TABLET | ORAL | 1 refills | Status: DC

## 2019-12-08 NOTE — Addendum Note (Signed)
Addended by: Jon Billings on: 12/08/2019 02:29 PM   Modules accepted: Orders

## 2019-12-08 NOTE — Telephone Encounter (Signed)
Spoke with patient and let him know that a Rx for eliquis would be sent to his pharmacy for him to try. Per pt he will not be able to afford the Eliquis either. He is aware that plavix will no help with DVT and verbalized to me that he just can not afford the medication.

## 2019-12-08 NOTE — Telephone Encounter (Signed)
Dr. Ethelene Hal please advise

## 2019-12-08 NOTE — Telephone Encounter (Signed)
Patient is calling and requesting his Leslie Harrington be switched to Plavix due to cost and sent to [facility] on [facility] . CB is [phone].

## 2019-12-09 ENCOUNTER — Telehealth: Payer: Self-pay | Admitting: Family

## 2019-12-09 LAB — ANTIPHOSPHOLIPID SYNDROME DIAGNOSTIC PANEL
Anticardiolipin IgA: <11 [APL'U] (ref <=11)
Anticardiolipin IgG: <14 [GPL'U] (ref <=14)
Anticardiolipin IgM: 16 [MPL'U] — ABNORMAL HIGH (ref <=12)
Beta-2 Glyco 1 IgA: <9 SAU (ref <=20)
Beta-2 Glyco 1 IgM: <9 SMU (ref <=20)
Beta-2 Glyco I IgG: <9 SGU (ref <=20)
PTT-LA Screen: 33 s (ref <=40)
dRVVT: 35 s (ref <=45)

## 2019-12-09 LAB — PROTEIN C, TOTAL AND FUNCTIONAL PANEL
PROTEIN C, ANTIGEN: 74 % (ref 70–140)
Protein C Activity: 83 % (ref 70–180)

## 2019-12-09 LAB — D-DIMER, QUANTITATIVE: D-Dimer, Quant: 1.3 mcg/mL FEU — ABNORMAL HIGH (ref <0.50)

## 2019-12-09 NOTE — Telephone Encounter (Signed)
Called pt and explained Dr. Bebe Shaggy message. Due to pt's limited work schedule, pt was only available to come the same day as his OV with Dr. Ethelene Hal. Dr. Ethelene Hal gave the ok but expressed explain to the pt that this is two different visit that he wanted him to see Leslie Harrington first then he would still do his office visit. Pt understood and nurse visit was scheduled.

## 2019-12-09 NOTE — Telephone Encounter (Signed)
lvm to inform patient of new patient appointment 1/28 at 11 am.

## 2019-12-13 ENCOUNTER — Other Ambulatory Visit: Payer: Self-pay

## 2019-12-13 ENCOUNTER — Encounter (HOSPITAL_BASED_OUTPATIENT_CLINIC_OR_DEPARTMENT_OTHER): Payer: Self-pay

## 2019-12-13 ENCOUNTER — Emergency Department (HOSPITAL_BASED_OUTPATIENT_CLINIC_OR_DEPARTMENT_OTHER): Payer: Managed Care, Other (non HMO)

## 2019-12-13 ENCOUNTER — Emergency Department (HOSPITAL_BASED_OUTPATIENT_CLINIC_OR_DEPARTMENT_OTHER)
Admission: EM | Admit: 2019-12-13 | Discharge: 2019-12-13 | Disposition: A | Discharge status: Home / Self Care | Payer: Managed Care, Other (non HMO) | Attending: Emergency Medicine | Admitting: Emergency Medicine

## 2019-12-13 DIAGNOSIS — F17210 Nicotine dependence, cigarettes, uncomplicated: Secondary | ICD-10-CM | POA: Diagnosis not present

## 2019-12-13 DIAGNOSIS — R002 Palpitations: Secondary | ICD-10-CM | POA: Diagnosis present

## 2019-12-13 DIAGNOSIS — I10 Essential (primary) hypertension: Secondary | ICD-10-CM | POA: Diagnosis not present

## 2019-12-13 DIAGNOSIS — Z79899 Other long term (current) drug therapy: Secondary | ICD-10-CM | POA: Diagnosis not present

## 2019-12-13 DIAGNOSIS — R531 Weakness: Secondary | ICD-10-CM | POA: Insufficient documentation

## 2019-12-13 DIAGNOSIS — R42 Dizziness and giddiness: Secondary | ICD-10-CM | POA: Diagnosis not present

## 2019-12-13 DIAGNOSIS — R030 Elevated blood-pressure reading, without diagnosis of hypertension: Secondary | ICD-10-CM

## 2019-12-13 LAB — CBC WITH DIFFERENTIAL/PLATELET
Abs Immature Granulocytes: 0.02 10*3/uL (ref 0.00–0.07)
Basophils Absolute: 0.1 10*3/uL (ref 0.0–0.1)
Basophils Relative: 1 %
Eosinophils Absolute: 0.2 10*3/uL (ref 0.0–0.5)
Eosinophils Relative: 3 %
HCT: 45.2 % (ref 39.0–52.0)
Hemoglobin: 15.3 g/dL (ref 13.0–17.0)
Immature Granulocytes: 0 %
Lymphocytes Relative: 23 %
Lymphs Abs: 2 10*3/uL (ref 0.7–4.0)
MCH: 29.2 pg (ref 26.0–34.0)
MCHC: 33.8 g/dL (ref 30.0–36.0)
MCV: 86.3 fL (ref 80.0–100.0)
Monocytes Absolute: 0.5 10*3/uL (ref 0.1–1.0)
Monocytes Relative: 6 %
Neutro Abs: 5.8 10*3/uL (ref 1.7–7.7)
Neutrophils Relative %: 67 %
Platelets: 242 10*3/uL (ref 150–400)
RBC: 5.24 MIL/uL (ref 4.22–5.81)
RDW: 14.2 % (ref 11.5–15.5)
WBC: 8.6 10*3/uL (ref 4.0–10.5)
nRBC: 0 % (ref 0.0–0.2)

## 2019-12-13 LAB — COMPREHENSIVE METABOLIC PANEL
ALT: 12 U/L (ref 0–44)
AST: 13 U/L — ABNORMAL LOW (ref 15–41)
Albumin: 4 g/dL (ref 3.5–5.0)
Alkaline Phosphatase: 67 U/L (ref 38–126)
Anion gap: 7 (ref 5–15)
BUN: 16 mg/dL (ref 8–23)
CO2: 24 mmol/L (ref 22–32)
Calcium: 9.1 mg/dL (ref 8.9–10.3)
Chloride: 106 mmol/L (ref 98–111)
Creatinine, Ser: 1.01 mg/dL (ref 0.61–1.24)
GFR calc Af Amer: >60 mL/min (ref >60)
GFR calc non Af Amer: >60 mL/min (ref >60)
Glucose, Bld: 95 mg/dL (ref 70–99)
Potassium: 4.2 mmol/L (ref 3.5–5.1)
Sodium: 137 mmol/L (ref 135–145)
Total Bilirubin: 0.8 mg/dL (ref 0.3–1.2)
Total Protein: 7 g/dL (ref 6.5–8.1)

## 2019-12-13 LAB — TROPONIN I (HIGH SENSITIVITY): Troponin I (High Sensitivity): 5 ng/L (ref <18)

## 2019-12-13 MED ORDER — IOHEXOL 350 MG/ML SOLN
100.0000 mL | Freq: Once | INTRAVENOUS | Status: AC | PRN
  Administered 2019-12-13: 100 mL via INTRAVENOUS

## 2019-12-13 NOTE — ED Notes (Signed)
Pt on monitor 

## 2019-12-13 NOTE — ED Notes (Signed)
Pt ambulated around RN station. Pt o2 consistent at 98% RA, HR 92-98/min. Pt voiced no complaints, so s/s of distress, no tachypnea noted per Legrand Como, RN

## 2019-12-13 NOTE — ED Notes (Signed)
ED Provider at bedside. 

## 2019-12-13 NOTE — ED Triage Notes (Signed)
Pt arrives to ED POV, reports that he saw his dr last Tuesday and was diagnosed with a blood clot in his leg. States that he has not been able start his blood thinner yet. C/o weakness in legs and rapid HR.

## 2019-12-13 NOTE — ED Notes (Signed)
EKG to [facility]

## 2019-12-13 NOTE — Discharge Instructions (Addendum)
Please make an appointment with Cardiology Follow up with your PCP regarding starting Coumadin Return if worsening

## 2019-12-13 NOTE — ED Provider Notes (Signed)
[facility] EMERGENCY DEPARTMENT Provider Note   CSN: [id number] Arrival date & time: 12/13/19  1500     History Chief Complaint  Patient presents with  . Tachycardia  . Extremity Weakness    Leslie Harrington is a 64 y.o. male with history of DVT who presents with palpitations.  Patient states that over the past week he has had intermittent episodes where he feels like his heart is racing, he will get lightheaded and feel woozy, his legs will feel weak, and he will have some mild left-sided chest pain.  The episode will last longer than a couple of minutes but less than an hour.  He states it usually happens at home or at work.  Is not associated with exertion and will occur at random.  He states that he recently has been having some left calf pain and saw his doctor who ordered a DVT study.  DVT study was negative although he had an elevated D-dimer and due to his history of blood clots in the past they opted to anticoagulate him.  Unfortunately he could not afford the Xarelto so prescription was changed to Coumadin and he is not scheduled to start this medicine until Thursday.  He states that he had another episode last night and then this morning.  Since the episodes were so close together he decided he needed to come to the ED to be checked out.  He denies headache, syncope, current chest pain, current palpitations, shortness of breath, abdominal pain, nausea, vomiting, diarrhea.  He has chronic left lower extremity swelling due to chronic basis venous stasis.  He denies history of heart or lung disease.  He is a smoker.  HPI     Past Medical History:  Diagnosis Date  . Acute bronchitis 09/17/2007  . ALLERGIC RHINITIS 05/10/2010  . ANXIETY DEPRESSION 08/08/2010  . ASTHMA, CHILDHOOD 09/17/2007  . CONJUNCTIVITIS, ACUTE, BILATERAL 05/10/2010  . ERECTILE DYSFUNCTION 09/17/2007  . HYPERLIPIDEMIA 08/08/2010  . OLECRANON BURSITIS, LEFT 08/08/2010  . SINUSITIS- ACUTE-NOS 02/07/2009  .  Unspecified hearing loss 02/07/2009  . Wheezing 01/04/2010    Patient Active Problem List   Diagnosis Date Noted  . Venous insufficiency (chronic) (peripheral) 02/17/2018  . Need for influenza vaccination 11/05/2017  . 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 Tobacco use 11/05/2017  . Heme positive stool 11/05/2017  . Benign prostatic hyperplasia with urinary hesitancy 11/05/2017  . Edema of left lower extremity 12/25/2011  . Leg pain, left 12/25/2011  . Phlebitis and thrombophlebitis of lower extremities 12/25/2011  . Healthcare maintenance 03/27/2011  . HYPERLIPIDEMIA 08/08/2010  . ANXIETY DEPRESSION 08/08/2010  . ALLERGIC RHINITIS 05/10/2010  . ERECTILE DYSFUNCTION 09/17/2007  . ASTHMA, CHILDHOOD 09/17/2007    Past Surgical History:  Procedure Laterality Date  . WISDOM TOOTH EXTRACTION     As a teen       Family History  Problem Relation Age of Onset  . Diabetes Other        1st degree relative  . Hyperlipidemia Other   . Hypertension Other   . Heart disease Maternal Grandfather   . Heart disease Paternal Grandfather   . Heart disease Maternal Uncle   . Colon cancer Neg Hx     Social History   Tobacco Use  . Smoking status: Current Every Day Smoker    Packs/day: 1.00    Years: 40.00    Pack years: 40.00    Types: Cigarettes  . Smokeless  tobacco: Never Used  Substance Use Topics  . Alcohol use: Yes    Comment: occ beer  . Drug use: No    Home Medications Prior to Admission medications   Medication Sig Start Date End Date Taking? Authorizing Provider  tamsulosin (FLOMAX) 0.4 MG CAPS capsule Take 1 capsule (0.4 mg total) by mouth daily. 12/06/19  Yes Libby Maw, MD  sildenafil (REVATIO) 20 MG tablet May take 1-5 pills 45 minutes prior daily as needed. 02/17/18   Libby Maw, MD    Allergies    Patient has no known allergies.  Review of Systems   Review of Systems  Constitutional:  Negative for fever.  Respiratory: Negative for cough, shortness of breath and wheezing.   Cardiovascular: Positive for chest pain, palpitations and leg swelling.  Musculoskeletal: Positive for myalgias. Negative for arthralgias.  Neurological: Positive for weakness and light-headedness. Negative for syncope.  All other systems reviewed and are negative.   Physical Exam Updated Vital Signs BP (!) 164/84 (BP Location: Left Arm)   Pulse 72   Temp 99.5 F (37.5 C) (Oral)   Resp (!) 22   Ht 6' (1.829 m)   Wt 89.4 kg   SpO2 100%   BMI 26.72 kg/m   Physical Exam Vitals and nursing note reviewed.  Constitutional:      General: He is not in acute distress.    Appearance: Normal appearance. He is well-developed. He is not ill-appearing.  HENT:     Head: Normocephalic and atraumatic.  Eyes:     General: No scleral icterus.       Right eye: No discharge.        Left eye: No discharge.     Conjunctiva/sclera: Conjunctivae normal.     Pupils: Pupils are equal, round, and reactive to light.  Cardiovascular:     Rate and Rhythm: Normal rate and regular rhythm.  Pulmonary:     Effort: Pulmonary effort is normal. No respiratory distress.     Breath sounds: Wheezing present.  Abdominal:     General: There is no distension.     Palpations: Abdomen is soft.     Tenderness: There is no abdominal tenderness.  Musculoskeletal:        General: Tenderness (calf) present.     Cervical back: Normal range of motion.     Left lower leg: Edema (chronic) present.  Skin:    General: Skin is warm and dry.     Comments: Chronic venous stasis changes  Neurological:     Mental Status: He is alert and oriented to person, place, and time.  Psychiatric:        Mood and Affect: Mood normal.        Behavior: Behavior normal.     ED Results / Procedures / Treatments   Labs (all labs ordered are listed, but only abnormal results are displayed) Labs Reviewed  COMPREHENSIVE METABOLIC PANEL -  Abnormal; Notable for the following components:      Result Value   AST 13 (*)    All other components within normal limits  CBC WITH DIFFERENTIAL/PLATELET  TROPONIN I (HIGH SENSITIVITY)    EKG EKG Interpretation  Date/Time:  Tuesday December 13 2019 15:09:55 EST Ventricular Rate:  93 PR Interval:  162 QRS Duration: 84 QT Interval:  356 QTC Calculation: 442 R Axis:   93 Text Interpretation: Poor data quality, interpretation may be adversely affected Normal sinus rhythm Rightward axis Anterior infarct , age undetermined Abnormal ECG Confirmed  by Davonna Belling [phone]) on 12/13/2019 3:15:31 PM   Radiology CT Angio Chest PE W and/or Wo Contrast  Addendum Date: 12/13/2019   ADDENDUM REPORT: 12/13/2019 17:29 ADDENDUM: There is an apparent posterior right mid chest sebaceous cyst measuring 2.4 x 1.6 cm. Electronically Signed   By: Lowella Grip III M.D.   On: 12/13/2019 17:29   Result Date: 12/13/2019 CLINICAL DATA:  Chest pain with cardiac palpitations EXAM: CT ANGIOGRAPHY CHEST WITH CONTRAST TECHNIQUE: Multidetector CT imaging of the chest was performed using the standard protocol during bolus administration of intravenous contrast. Multiplanar CT image reconstructions and MIPs were obtained to evaluate the vascular anatomy. CONTRAST:  18mL OMNIPAQUE IOHEXOL 350 MG/ML SOLN COMPARISON:  Chest radiograph April 26, 2013 FINDINGS: Cardiovascular: There is no demonstrable pulmonary embolus. There is no thoracic aortic aneurysm or dissection. Visualized great vessels appear unremarkable. There is no pericardial effusion or pericardial thickening. Mediastinum/Nodes: Visualized thyroid appears normal. There is no appreciable thoracic adenopathy. No esophageal lesions are evident. Lungs/Pleura: Small bullae are noted near the apices medially on each side. There is slight bibasilar atelectasis. No edema or airspace opacity. No pleural effusions on either side. Upper Abdomen: There is a probable cyst  near the dome of the liver on the right anteriorly measuring 1.3 x 1.0 cm. Visualized upper abdominal structures otherwise appear unremarkable. Musculoskeletal: No blastic or lytic bone lesions evident. There is slight anterior wedging at T6. No chest wall lesions evident. Review of the MIP images confirms the above findings. IMPRESSION: 1. No demonstrable pulmonary embolus. No thoracic aortic aneurysm or dissection. 2. Areas of slight atelectatic change. Lungs edema or airspace opacity. No pleural effusions. 3.  No evident adenopathy. Electronically Signed: By: Lowella Grip III M.D. On: 12/13/2019 17:11    Procedures Procedures (including critical care time)  Medications Ordered in ED Medications  iohexol (OMNIPAQUE) 350 MG/ML injection 100 mL (100 mLs Intravenous Contrast Given 12/13/19 1659)    ED Course  I have reviewed the triage vital signs and the nursing notes.  Pertinent labs & imaging results that were available during my care of the patient were reviewed by me and considered in my medical decision making (see chart for details).  64 year old male presents with palpitations, lightheadedness, leg weakness. Had a positive D-dimer as an outpatient but negative doppler study. EKG is SR. Exam is overall reassuring. His vitals are normal here but symptoms are more episodic. Labs are reassuring. Trop is normal. CTA to r/o PE was obtained and is negative. He ambulated and did well. His symptoms are most concerning for something such as an arrhythmia that we are missing. He also has a significant family hx of heart disease. I advised him to f/u with Cardiology for possible holter monitoring.   MDM Rules/Calculators/A&P                       Final Clinical Impression(s) / ED Diagnoses Final diagnoses:  Palpitations  Elevated blood pressure reading  Lightheaded    Rx / DC Orders ED Discharge Orders    None       Recardo Evangelist, PA-C 12/13/19 1901    Davonna Belling,  MD 12/13/19 2325

## 2019-12-14 ENCOUNTER — Other Ambulatory Visit: Payer: Self-pay

## 2019-12-14 ENCOUNTER — Ambulatory Visit (INDEPENDENT_AMBULATORY_CARE_PROVIDER_SITE_OTHER): Payer: Managed Care, Other (non HMO) | Admitting: Cardiology

## 2019-12-14 ENCOUNTER — Encounter: Payer: Self-pay | Admitting: Cardiology

## 2019-12-14 ENCOUNTER — Ambulatory Visit (INDEPENDENT_AMBULATORY_CARE_PROVIDER_SITE_OTHER): Payer: Managed Care, Other (non HMO)

## 2019-12-14 VITALS — BP 158/88 | HR 104 | Ht 72.0 in | Wt 197.1 lb

## 2019-12-14 DIAGNOSIS — R0789 Other chest pain: Secondary | ICD-10-CM | POA: Diagnosis not present

## 2019-12-14 DIAGNOSIS — R030 Elevated blood-pressure reading, without diagnosis of hypertension: Secondary | ICD-10-CM | POA: Insufficient documentation

## 2019-12-14 DIAGNOSIS — F17210 Nicotine dependence, cigarettes, uncomplicated: Secondary | ICD-10-CM | POA: Insufficient documentation

## 2019-12-14 DIAGNOSIS — R531 Weakness: Secondary | ICD-10-CM

## 2019-12-14 DIAGNOSIS — R002 Palpitations: Secondary | ICD-10-CM

## 2019-12-14 NOTE — Progress Notes (Signed)
Cardiology Office Note:    Date:  12/14/2019   ID:  Leslie Harrington, DOB May 20, 1956, MRN [id number]  PCP:  Libby Maw, MD  Cardiologist:  Jenean Lindau, MD   Referring MD: Libby Maw,*    ASSESSMENT:    1. Palpitations   2. Weakness   3. Chest discomfort   4. Cigarette smoker   5. Elevated blood pressure reading in office without diagnosis of hypertension    PLAN:    In order of problems listed above:  1. Palpitations: I discussed my findings with the patient at extensive length.  Please appear to be concerning and therefore I recommended a 2-week event monitor.  We will also do a TSH today.  Precautions especially for precautions were advised.  He has never had a syncopal event. 2. Chest discomfort: Atypical for coronary etiology however in view of risk factors we will do a Lexiscan sestamibi. 3. Cigarette smoker: I spent 5 minutes with the patient discussing solely about smoking. Smoking cessation was counseled. I suggested to the patient also different medications and pharmacological interventions. Patient is keen to try stopping on its own at this time. He will get back to me if he needs any further assistance in this matter. 4. Follow-up appoint a month or earlier if he has any concerns.   Medication Adjustments/Labs and Tests Ordered: Current medicines are reviewed at length with the patient today.  Concerns regarding medicines are outlined above.  Orders Placed This Encounter  Procedures  . TSH  . MYOCARDIAL PERFUSION IMAGING  . LONG TERM MONITOR-LIVE TELEMETRY (3-14 DAYS)   No orders of the defined types were placed in this encounter.    History of Present Illness:    Leslie Harrington is a 64 y.o. male who is being seen today for the evaluation of palpitations and chest discomfort at the request of Libby Maw,*.  Patient is a pleasant 64 year old male.  He has past medical history of cigarette smoking.  He denies any history of  hypertension.  Patient mentions to me that she had significant palpitations and went to the emergency room.  He was evaluated.  I reviewed emergency room notes extensively.  He denies any chest pain orthopnea or PND.  At the time of my evaluation, the patient is alert awake oriented and in no distress.  He is very concerned about it and therefore sought evaluation.  He denies any chest pain he occasionally has chest discomfort.  He says this is not related to exertion.  At the time of my evaluation, the patient is alert awake oriented and in no distress.  Past Medical History:  Diagnosis Date  . Acute bronchitis 09/17/2007  . ALLERGIC RHINITIS 05/10/2010  . ANXIETY DEPRESSION 08/08/2010  . ASTHMA, CHILDHOOD 09/17/2007  . CONJUNCTIVITIS, ACUTE, BILATERAL 05/10/2010  . ERECTILE DYSFUNCTION 09/17/2007  . HYPERLIPIDEMIA 08/08/2010  . OLECRANON BURSITIS, LEFT 08/08/2010  . SINUSITIS- ACUTE-NOS 02/07/2009  . Unspecified hearing loss 02/07/2009  . Wheezing 01/04/2010    Past Surgical History:  Procedure Laterality Date  . WISDOM TOOTH EXTRACTION     As a teen    Current Medications: Current Meds  Medication Sig  . sildenafil (REVATIO) 20 MG tablet May take 1-5 pills 45 minutes prior daily as needed.  . tamsulosin (FLOMAX) 0.4 MG CAPS capsule Take 1 capsule (0.4 mg total) by mouth daily.   Current Facility-Administered Medications for the 12/14/19 encounter (Office Visit) with Amadi Yoshino, Reita Cliche, MD  Medication  . 0.9 %  sodium chloride infusion     Allergies:   Patient has no known allergies.   Social History   Socioeconomic History  . Marital status: Significant Other    Spouse name: Not on file  . Number of children: 2  . Years of education: Not on file  . Highest education level: Not on file  Occupational History  . Not on file  Tobacco Use  . Smoking status: Current Every Day Smoker    Packs/day: 1.00    Years: 40.00    Pack years: 40.00    Types: Cigarettes  . Smokeless  tobacco: Never Used  Substance and Sexual Activity  . Alcohol use: Yes    Comment: occ beer  . Drug use: No  . Sexual activity: Not Currently  Other Topics Concern  . Not on file  Social History Narrative  . Not on file   Social Determinants of Health   Financial Resource Strain:   . Difficulty of Paying Living Expenses: Not on file  Food Insecurity:   . Worried About Charity fundraiser in the Last Year: Not on file  . Ran Out of Food in the Last Year: Not on file  Transportation Needs:   . Lack of Transportation (Medical): Not on file  . Lack of Transportation (Non-Medical): Not on file  Physical Activity:   . Days of Exercise per Week: Not on file  . Minutes of Exercise per Session: Not on file  Stress:   . Feeling of Stress : Not on file  Social Connections:   . Frequency of Communication with Friends and Family: Not on file  . Frequency of Social Gatherings with Friends and Family: Not on file  . Attends Religious Services: Not on file  . Active Member of Clubs or Organizations: Not on file  . Attends Archivist Meetings: Not on file  . Marital Status: Not on file     Family History: The patient's family history includes Diabetes in an other family member; Heart disease in his maternal grandfather, maternal uncle, and paternal grandfather; Hyperlipidemia in an other family member; Hypertension in an other family member. There is no history of Colon cancer.  ROS:   Please see the history of present illness.    All other systems reviewed and are negative.  EKGs/Labs/Other Studies Reviewed:    The following studies were reviewed today: EKG done in the emergency room revealed sinus rhythm and nonspecific ST-T changes with   Recent Labs: 12/13/2019: ALT 12; BUN 16; Creatinine, Ser 1.01; Hemoglobin 15.3; Platelets 242; Potassium 4.2; Sodium 137  Recent Lipid Panel    Component Value Date/Time   CHOL 184 08/08/2010 1154   TRIG 67.0 08/08/2010 1154   HDL  39.80 08/08/2010 1154   CHOLHDL 5 08/08/2010 1154   VLDL 13.4 08/08/2010 1154   LDLCALC 131 (H) 08/08/2010 1154    Physical Exam:    VS:  BP (!) 158/88 (BP Location: Left Arm, Patient Position: Sitting, Cuff Size: Normal)   Pulse (!) 104   Ht 6' (1.829 m)   Wt 197 lb 1.9 oz (89.4 kg)   SpO2 98%   BMI 26.73 kg/m     Wt Readings from Last 3 Encounters:  12/14/19 197 lb 1.9 oz (89.4 kg)  12/13/19 197 lb (89.4 kg)  12/06/19 197 lb 6.4 oz (89.5 kg)     GEN: Patient is in no acute distress HEENT: Normal NECK: No JVD; No carotid bruits LYMPHATICS: No lymphadenopathy CARDIAC: S1 S2  regular, 2/6 systolic murmur at the apex. RESPIRATORY:  Clear to auscultation without rales, wheezing or rhonchi  ABDOMEN: Soft, non-tender, non-distended MUSCULOSKELETAL:  No edema; No deformity  SKIN: Warm and dry NEUROLOGIC:  Alert and oriented x 3 PSYCHIATRIC:  Normal affect    Signed, Jenean Lindau, MD  12/14/2019 3:23 PM    [facility]

## 2019-12-14 NOTE — Patient Instructions (Signed)
Medication Instructions:  Your physician recommends that you continue on your current medications as directed. Please refer to the Current Medication list given to you today.  *If you need a refill on your cardiac medications before your next appointment, please call your pharmacy*  Lab Work: Your physician recommends that you have a TSH drawn today  If you have labs (blood work) drawn today and your tests are completely normal, you will receive your results only by: Marland Kitchen MyChart Message (if you have MyChart) OR . A paper copy in the mail If you have any lab test that is abnormal or we need to change your treatment, we will call you to review the results.  Testing/Procedures: Your physician has requested that you have a lexiscan myoview. For further information please visit [email or web address]. Please follow instruction sheet, as given.  Your physician has recommended that you wear a ZIO monitor. ZIO monitors are medical devices that record the heart's electrical activity. Doctors most often use these monitors to diagnose arrhythmias. Arrhythmias are problems with the speed or rhythm of the heartbeat. The monitor is a small, portable device. You can wear one while you do your normal daily activities. This is usually used to diagnose what is causing palpitations/syncope (passing out).You will wear this device for 14 days    Follow-Up: At [facility], you and your health needs are our priority.  As part of our continuing mission to provide you with exceptional heart care, we have created designated Provider Care Teams.  These Care Teams include your primary Cardiologist (physician) and Advanced Practice Providers (APPs -  Physician Assistants and Nurse Practitioners) who all work together to provide you with the care you need, when you need it.  Your next appointment:   1 month(s)  The format for your next appointment:   In Person  Provider:   Jyl Heinz, MD  Other  Instructions Regadenoson injection What is this medicine? REGADENOSON is used to test the heart for coronary artery disease. It is used in patients who can not exercise for their stress test. This medicine may be used for other purposes; ask your health care provider or pharmacist if you have questions. COMMON BRAND NAME(S): Lexiscan What should I tell my health care provider before I take this medicine? They need to know if you have any of these conditions:  heart problems  lung or breathing disease, like asthma or COPD  an unusual or allergic reaction to regadenoson, other medicines, foods, dyes, or preservatives  pregnant or trying to get pregnant  breast-feeding How should I use this medicine? This medicine is for injection into a vein. It is given by a health care professional in a hospital or clinic setting. Talk to your pediatrician regarding the use of this medicine in children. Special care may be needed. Overdosage: If you think you have taken too much of this medicine contact a poison control center or emergency room at once. NOTE: This medicine is only for you. Do not share this medicine with others. What if I miss a dose? This does not apply. What may interact with this medicine?  caffeine  dipyridamole  guarana  theophylline This list may not describe all possible interactions. Give your health care provider a list of all the medicines, herbs, non-prescription drugs, or dietary supplements you use. Also tell them if you smoke, drink alcohol, or use illegal drugs. Some items may interact with your medicine. What should I watch for while using this medicine? Your condition will be  monitored carefully while you are receiving this medicine. Do not take medicines, foods, or drinks with caffeine (like coffee, tea, or colas) for at least 12 hours before your test. If you do not know if something contains caffeine, ask your health care professional. What side effects may I  notice from receiving this medicine? Side effects that you should report to your doctor or health care professional as soon as possible:  allergic reactions like skin rash, itching or hives, swelling of the face, lips, or tongue  breathing problems  chest pain, tightness or palpitations  severe headache Side effects that usually do not require medical attention (report to your doctor or health care professional if they continue or are bothersome):  flushing  headache  irritation or pain at site where injected  nausea, vomiting This list may not describe all possible side effects. Call your doctor for medical advice about side effects. You may report side effects to FDA at 1-800-FDA-1088. Where should I keep my medicine? This drug is given in a hospital or clinic and will not be stored at home. NOTE: This sheet is a summary. It may not cover all possible information. If you have questions about this medicine, talk to your doctor, pharmacist, or health care provider.  2020 Elsevier/Gold Standard (2008-07-10 15:08:13)  Cardiac Nuclear Scan A cardiac nuclear scan is a test that measures blood flow to the heart when a person is resting and when he or she is exercising. The test looks for problems such as:  Not enough blood reaching a portion of the heart.  The heart muscle not working normally. You may need this test if:  You have heart disease.  You have had abnormal lab results.  You have had heart surgery or a balloon procedure to open up blocked arteries (angioplasty).  You have chest pain.  You have shortness of breath. In this test, a radioactive dye (tracer) is injected into your bloodstream. After the tracer has traveled to your heart, an imaging device is used to measure how much of the tracer is absorbed by or distributed to various areas of your heart. This procedure is usually done at a hospital and takes 2-4 hours. Tell a health care provider about:  Any  allergies you have.  All medicines you are taking, including vitamins, herbs, eye drops, creams, and over-the-counter medicines.  Any problems you or family members have had with anesthetic medicines.  Any blood disorders you have.  Any surgeries you have had.  Any medical conditions you have.  Whether you are pregnant or may be pregnant. What are the risks? Generally, this is a safe procedure. However, problems may occur, including:  Serious chest pain and heart attack. This is only a risk if the stress portion of the test is done.  Rapid heartbeat.  Sensation of warmth in your chest. This usually passes quickly.  Allergic reaction to the tracer. What happens before the procedure?  Ask your health care provider about changing or stopping your regular medicines. This is especially important if you are taking diabetes medicines or blood thinners.  Follow instructions from your health care provider about eating or drinking restrictions.  Remove your jewelry on the day of the procedure. What happens during the procedure?  An IV will be inserted into one of your veins.  Your health care provider will inject a small amount of radioactive tracer through the IV.  You will wait for 20-40 minutes while the tracer travels through your bloodstream.  Your  heart activity will be monitored with an electrocardiogram (ECG).  You will lie down on an exam table.  Images of your heart will be taken for about 15-20 minutes.  You may also have a stress test. For this test, one of the following may be done: ? You will exercise on a treadmill or stationary bike. While you exercise, your heart's activity will be monitored with an ECG, and your blood pressure will be checked. ? You will be given medicines that will increase blood flow to parts of your heart. This is done if you are unable to exercise.  When blood flow to your heart has peaked, a tracer will again be injected through the  IV.  After 20-40 minutes, you will get back on the exam table and have more images taken of your heart.  Depending on the type of tracer used, scans may need to be repeated 3-4 hours later.  Your IV line will be removed when the procedure is over. The procedure may vary among health care providers and hospitals. What happens after the procedure?  Unless your health care provider tells you otherwise, you may return to your normal schedule, including diet, activities, and medicines.  Unless your health care provider tells you otherwise, you may increase your fluid intake. This will help to flush the contrast dye from your body. Drink enough fluid to keep your urine pale yellow.  Ask your health care provider, or the department that is doing the test: ? When will my results be ready? ? How will I get my results? Summary  A cardiac nuclear scan measures the blood flow to the heart when a person is resting and when he or she is exercising.  Tell your health care provider if you are pregnant.  Before the procedure, ask your health care provider about changing or stopping your regular medicines. This is especially important if you are taking diabetes medicines or blood thinners.  After the procedure, unless your health care provider tells you otherwise, increase your fluid intake. This will help flush the contrast dye from your body.  After the procedure, unless your health care provider tells you otherwise, you may return to your normal schedule, including diet, activities, and medicines. This information is not intended to replace advice given to you by your health care provider. Make sure you discuss any questions you have with your health care provider. Document Revised: 04/26/2018 Document Reviewed: 04/26/2018 Elsevier Patient Education  [place].

## 2019-12-15 ENCOUNTER — Ambulatory Visit (INDEPENDENT_AMBULATORY_CARE_PROVIDER_SITE_OTHER): Payer: Managed Care, Other (non HMO) | Admitting: Family Medicine

## 2019-12-15 ENCOUNTER — Ambulatory Visit: Payer: Managed Care, Other (non HMO)

## 2019-12-15 ENCOUNTER — Encounter: Payer: Self-pay | Admitting: Family Medicine

## 2019-12-15 VITALS — BP 168/92 | Temp 98.1°F | Ht 72.0 in | Wt 194.0 lb

## 2019-12-15 DIAGNOSIS — L989 Disorder of the skin and subcutaneous tissue, unspecified: Secondary | ICD-10-CM | POA: Diagnosis not present

## 2019-12-15 DIAGNOSIS — F341 Dysthymic disorder: Secondary | ICD-10-CM | POA: Diagnosis not present

## 2019-12-15 DIAGNOSIS — I872 Venous insufficiency (chronic) (peripheral): Secondary | ICD-10-CM | POA: Diagnosis not present

## 2019-12-15 DIAGNOSIS — R7989 Other specified abnormal findings of blood chemistry: Secondary | ICD-10-CM | POA: Diagnosis not present

## 2019-12-15 LAB — TSH: TSH: 1.47 u[IU]/mL (ref 0.450–4.500)

## 2019-12-15 MED ORDER — PAROXETINE HCL 10 MG PO TABS: 10.0000 mg | ORAL_TABLET | Freq: Every day | ORAL | 0 refills | Status: DC

## 2019-12-15 NOTE — Progress Notes (Signed)
Established Patient Office Visit  Subjective:  Patient ID: Leslie Harrington, male    DOB: 09/29/1956  Age: 64 y.o. MRN: [id number]  CC:  Chief Complaint  Patient presents with  . Follow-up    f/u on DVT, c/o numbness in legs still swollen, arms feel heavy at times.    HPI Leslie Harrington presents for follow-up of the swelling in his left leg with a elevated D-dimer and negative venous sonogram.  He applied compression stockings as directed in the leg has greatly decreased in size and is less tender.  Has follow-up scheduled with hematology this coming week.  Was seen in the emergency room for palpitations.  CT angiogram of the chest was negative for pulmonary embolism.  He has been having tingling in his hands and feet.  He has felt anxious.  Past Medical History:  Diagnosis Date  . Acute bronchitis 09/17/2007  . ALLERGIC RHINITIS 05/10/2010  . ANXIETY DEPRESSION 08/08/2010  . ASTHMA, CHILDHOOD 09/17/2007  . CONJUNCTIVITIS, ACUTE, BILATERAL 05/10/2010  . ERECTILE DYSFUNCTION 09/17/2007  . HYPERLIPIDEMIA 08/08/2010  . OLECRANON BURSITIS, LEFT 08/08/2010  . SINUSITIS- ACUTE-NOS 02/07/2009  . Unspecified hearing loss 02/07/2009  . Wheezing 01/04/2010    Past Surgical History:  Procedure Laterality Date  . WISDOM TOOTH EXTRACTION     As a teen    Family History  Problem Relation Age of Onset  . Diabetes Other        1st degree relative  . Hyperlipidemia Other   . Hypertension Other   . Heart disease Maternal Grandfather   . Heart disease Paternal Grandfather   . Heart disease Maternal Uncle   . Colon cancer Neg Hx     Social History   Socioeconomic History  . Marital status: Significant Other    Spouse name: Not on file  . Number of children: 2  . Years of education: Not on file  . Highest education level: Not on file  Occupational History  . Not on file  Tobacco Use  . Smoking status: Current Every Day Smoker    Packs/day: 1.00    Years: 40.00    Pack years: 40.00     Types: Cigarettes  . Smokeless tobacco: Never Used  Substance and Sexual Activity  . Alcohol use: Yes    Comment: occ beer  . Drug use: No  . Sexual activity: Not Currently  Other Topics Concern  . Not on file  Social History Narrative  . Not on file   Social Determinants of Health   Financial Resource Strain:   . Difficulty of Paying Living Expenses: Not on file  Food Insecurity:   . Worried About Charity fundraiser in the Last Year: Not on file  . Ran Out of Food in the Last Year: Not on file  Transportation Needs:   . Lack of Transportation (Medical): Not on file  . Lack of Transportation (Non-Medical): Not on file  Physical Activity:   . Days of Exercise per Week: Not on file  . Minutes of Exercise per Session: Not on file  Stress:   . Feeling of Stress : Not on file  Social Connections:   . Frequency of Communication with Friends and Family: Not on file  . Frequency of Social Gatherings with Friends and Family: Not on file  . Attends Religious Services: Not on file  . Active Member of Clubs or Organizations: Not on file  . Attends Archivist Meetings: Not on file  .  Marital Status: Not on file  Intimate Partner Violence:   . Fear of Current or Ex-Partner: Not on file  . Emotionally Abused: Not on file  . Physically Abused: Not on file  . Sexually Abused: Not on file    Outpatient Medications Prior to Visit  Medication Sig Dispense Refill  . sildenafil (REVATIO) 20 MG tablet May take 1-5 pills 45 minutes prior daily as needed. (Patient not taking: Reported on 12/15/2019) 50 tablet 1  . tamsulosin (FLOMAX) 0.4 MG CAPS capsule Take 1 capsule (0.4 mg total) by mouth daily. (Patient not taking: Reported on 12/15/2019) 30 capsule 2   Facility-Administered Medications Prior to Visit  Medication Dose Route Frequency Provider Last Rate Last Admin  . 0.9 %  sodium chloride infusion  500 mL Intravenous Once Ladene Artist, MD        No Known  Allergies  ROS Review of Systems  Constitutional: Negative.   Respiratory: Negative.   Cardiovascular: Negative.   Gastrointestinal: Negative.   Musculoskeletal: Negative for arthralgias, gait problem and joint swelling.  Skin: Positive for color change and pallor.  Neurological: Positive for numbness. Negative for dizziness, speech difficulty, weakness and light-headedness.  Hematological: Negative.   Psychiatric/Behavioral: Positive for dysphoric mood. The patient is nervous/anxious.       Objective:    Physical Exam  Constitutional: He appears well-developed and well-nourished. No distress.  HENT:  Head: Normocephalic and atraumatic.  Right Ear: External ear normal.  Left Ear: External ear normal.  Eyes: Right eye exhibits no discharge. Left eye exhibits no discharge. No scleral icterus.  Neck: No JVD present. No tracheal deviation present.  Cardiovascular: Normal rate, regular rhythm and normal heart sounds.  Pulmonary/Chest: No stridor. He has decreased breath sounds. He has no wheezes. He has no rhonchi. He has no rales.  Skin: He is not diaphoretic.       BP (!) 168/92   Temp 98.1 F (36.7 C) (Tympanic)   Ht 6' (1.829 m)   Wt 194 lb (88 kg)   BMI 26.31 kg/m  Wt Readings from Last 3 Encounters:  12/15/19 194 lb (88 kg)  12/14/19 197 lb 1.9 oz (89.4 kg)  12/13/19 197 lb (89.4 kg)     Health Maintenance Due  Topic Date Due  . Hepatitis C Screening  01/14/1956  . HIV Screening  11/21/1973  . TETANUS/TDAP  11/21/1977    There are no preventive care reminders to display for this patient.  Lab Results  Component Value Date   TSH 1.470 12/14/2019   Lab Results  Component Value Date   WBC 8.6 12/13/2019   HGB 15.3 12/13/2019   HCT 45.2 12/13/2019   MCV 86.3 12/13/2019   PLT 242 12/13/2019   Lab Results  Component Value Date   NA 137 12/13/2019   K 4.2 12/13/2019   CO2 24 12/13/2019   GLUCOSE 95 12/13/2019   BUN 16 12/13/2019   CREATININE 1.01  12/13/2019   BILITOT 0.8 12/13/2019   ALKPHOS 67 12/13/2019   AST 13 (L) 12/13/2019   ALT 12 12/13/2019   PROT 7.0 12/13/2019   ALBUMIN 4.0 12/13/2019   CALCIUM 9.1 12/13/2019   ANIONGAP 7 12/13/2019   GFR 109.01 12/25/2011   Lab Results  Component Value Date   CHOL 184 08/08/2010   Lab Results  Component Value Date   HDL 39.80 08/08/2010   Lab Results  Component Value Date   LDLCALC 131 (H) 08/08/2010   Lab Results  Component Value  Date   TRIG 67.0 08/08/2010   Lab Results  Component Value Date   CHOLHDL 5 08/08/2010   No results found for: HGBA1C    Assessment & Plan:   Problem List Items Addressed This Visit      Cardiovascular and Mediastinum   Venous insufficiency (chronic) (peripheral) - Primary     Musculoskeletal and Integument   Skin lesion of right leg   Relevant Orders   Ambulatory referral to Dermatology     Other   Dysthymia   Relevant Medications   PARoxetine (PAXIL) 10 MG tablet   D-dimer, elevated   Relevant Orders   D-Dimer, Quantitative      Meds ordered this encounter  Medications  . PARoxetine (PAXIL) 10 MG tablet    Sig: Take 1 tablet (10 mg total) by mouth daily.    Dispense:  30 tablet    Refill:  0    Follow-up: Return in about 2 weeks (around 12/29/2019).   We will start low-dose Paxil.  Follow-up with hematology next week.  Appreciate their input.  Advised patient to please stop smoking.  Follow-up in 1 month with me.  And concerned about the lesion on his right lateral calf. Libby Maw, MD

## 2019-12-17 LAB — D-DIMER, QUANTITATIVE: D-Dimer, Quant: 0.88 mcg/mL FEU — ABNORMAL HIGH (ref <0.50)

## 2019-12-19 ENCOUNTER — Other Ambulatory Visit: Payer: Self-pay | Admitting: Family

## 2019-12-19 ENCOUNTER — Encounter: Payer: Self-pay | Admitting: Family Medicine

## 2019-12-19 DIAGNOSIS — I82402 Acute embolism and thrombosis of unspecified deep veins of left lower extremity: Secondary | ICD-10-CM

## 2019-12-20 ENCOUNTER — Encounter: Payer: Self-pay | Admitting: Family

## 2019-12-20 ENCOUNTER — Other Ambulatory Visit: Payer: Self-pay

## 2019-12-20 ENCOUNTER — Inpatient Hospital Stay: Payer: Managed Care, Other (non HMO) | Attending: Hematology

## 2019-12-20 ENCOUNTER — Inpatient Hospital Stay (HOSPITAL_BASED_OUTPATIENT_CLINIC_OR_DEPARTMENT_OTHER): Payer: Managed Care, Other (non HMO) | Admitting: Family

## 2019-12-20 VITALS — BP 163/87 | HR 75 | Temp 97.5°F | Resp 19 | Ht 72.0 in | Wt 191.0 lb

## 2019-12-20 DIAGNOSIS — R7989 Other specified abnormal findings of blood chemistry: Secondary | ICD-10-CM

## 2019-12-20 DIAGNOSIS — Z86718 Personal history of other venous thrombosis and embolism: Secondary | ICD-10-CM | POA: Insufficient documentation

## 2019-12-20 DIAGNOSIS — I82402 Acute embolism and thrombosis of unspecified deep veins of left lower extremity: Secondary | ICD-10-CM

## 2019-12-20 LAB — CBC WITH DIFFERENTIAL (CANCER CENTER ONLY)
Abs Immature Granulocytes: 0.03 10*3/uL (ref 0.00–0.07)
Basophils Absolute: 0.1 10*3/uL (ref 0.0–0.1)
Basophils Relative: 1 %
Eosinophils Absolute: 0.1 10*3/uL (ref 0.0–0.5)
Eosinophils Relative: 2 %
HCT: 46.9 % (ref 39.0–52.0)
Hemoglobin: 16.2 g/dL (ref 13.0–17.0)
Immature Granulocytes: 0 %
Lymphocytes Relative: 18 %
Lymphs Abs: 1.3 10*3/uL (ref 0.7–4.0)
MCH: 29 pg (ref 26.0–34.0)
MCHC: 34.5 g/dL (ref 30.0–36.0)
MCV: 84.1 fL (ref 80.0–100.0)
Monocytes Absolute: 0.5 10*3/uL (ref 0.1–1.0)
Monocytes Relative: 7 %
Neutro Abs: 5.1 10*3/uL (ref 1.7–7.7)
Neutrophils Relative %: 72 %
Platelet Count: 262 10*3/uL (ref 150–400)
RBC: 5.58 MIL/uL (ref 4.22–5.81)
RDW: 13.6 % (ref 11.5–15.5)
WBC Count: 7.1 10*3/uL (ref 4.0–10.5)
nRBC: 0 % (ref 0.0–0.2)

## 2019-12-20 LAB — CMP (CANCER CENTER ONLY)
ALT: 10 U/L (ref 0–44)
AST: 11 U/L — ABNORMAL LOW (ref 15–41)
Albumin: 4.2 g/dL (ref 3.5–5.0)
Alkaline Phosphatase: 71 U/L (ref 38–126)
Anion gap: 8 (ref 5–15)
BUN: 14 mg/dL (ref 8–23)
CO2: 25 mmol/L (ref 22–32)
Calcium: 9.7 mg/dL (ref 8.9–10.3)
Chloride: 102 mmol/L (ref 98–111)
Creatinine: 1.04 mg/dL (ref 0.61–1.24)
GFR, Est AFR Am: >60 mL/min (ref >60)
GFR, Estimated: >60 mL/min (ref >60)
Glucose, Bld: 98 mg/dL (ref 70–99)
Potassium: 4 mmol/L (ref 3.5–5.1)
Sodium: 135 mmol/L (ref 135–145)
Total Bilirubin: 0.7 mg/dL (ref 0.3–1.2)
Total Protein: 6.9 g/dL (ref 6.5–8.1)

## 2019-12-20 LAB — IRON AND TIBC
Iron: 127 ug/dL (ref 42–163)
Saturation Ratios: 51 % (ref 20–55)
TIBC: 249 ug/dL (ref 202–409)
UIBC: 122 ug/dL (ref 117–376)

## 2019-12-20 LAB — LACTATE DEHYDROGENASE: LDH: 158 U/L (ref 98–192)

## 2019-12-20 LAB — ANTITHROMBIN III: AntiThromb III Func: 93 % (ref 75–120)

## 2019-12-20 LAB — FERRITIN: Ferritin: 279 ng/mL (ref 24–336)

## 2019-12-20 NOTE — Progress Notes (Addendum)
Hematology/Oncology Consultation   Name: Leslie Harrington      MRN: [id number]    Location: Room/bed info not found  Date: 12/20/2019 Time:9:18 AM   REFERRING PHYSICIAN: Libby Maw, MD  REASON FOR CONSULT: History of left lower extremity DVT   DIAGNOSIS:  History of left lower extremity DVT and mildly elevated d-dimer  Addendum: Heterozygous Factor V Leiden and questionable elevated anti-cardiolipin   HISTORY OF PRESENT ILLNESS: Leslie Harrington is a very pleasant 64 yo caucasian gentleman with history of left lower extremity DVT diagnosed in 2018. He had no prior history of thrombus. He states that he received around 6 months of anticoagulation with Xarelto. He has been off anticoagulation since that time.  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 His CT angio and US of the left lower extremity were both negative.  He was found to have a mildly elevated D-dimer at 1.30 and recheck 2 weeks later was down to 0.88.  He has since started wearing compression stockings daily. 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  He states that he has had an itchy "ulcer" on the back of his right calf that comes and goes for years. He is also having this looked at by his PCP.  He has occasional palpitations and dizziness and went to the ED. He states that his work up was negative.  He is not currently on an aspirin.  He stopped taking his Flomax due to the dizziness and has delayed bladder emptying at times.  He has a follow-up with his PCP regarding prostate exam and PSA check.  He states that he had his colonoscopy 2 years ago with Dr. Jerilynn Mages. [facility] and had 9 benign polyps removed from throughout his colon. He is due again for colonoscopy in 1 year.  No episodes of bleeding. No bruising or  petechiae. To report.  No fever, chills, n/v, cough, rash, SOB, chest pain, abdominal pain or changes in bowel or bladder habits.  He states that he has a nervous stomach and has has had less of an appetite lately. He is hydrating but admits that he needs more water throughout the day.  He is still smoking 1 ppd but is considering quitting.   ROS: All other 10 point review of systems is negative.   PAST MEDICAL HISTORY:   Past Medical History:  Diagnosis Date  . Acute bronchitis 09/17/2007  . ALLERGIC RHINITIS 05/10/2010  . ANXIETY DEPRESSION 08/08/2010  . ASTHMA, CHILDHOOD 09/17/2007  . CONJUNCTIVITIS, ACUTE, BILATERAL 05/10/2010  . ERECTILE DYSFUNCTION 09/17/2007  . HYPERLIPIDEMIA 08/08/2010  . OLECRANON BURSITIS, LEFT 08/08/2010  . SINUSITIS- ACUTE-NOS 02/07/2009  . Unspecified hearing loss 02/07/2009  . Wheezing 01/04/2010    ALLERGIES: No Known Allergies    MEDICATIONS:  Current Outpatient Medications on File Prior to Visit  Medication Sig Dispense Refill  . PARoxetine (PAXIL) 10 MG tablet Take 1 tablet (10 mg total) by mouth daily. 30 tablet 0  . sildenafil (REVATIO) 20 MG tablet May take 1-5 pills 45 minutes prior daily as needed. (Patient not taking: Reported on 12/15/2019) 50 tablet 1  . tamsulosin (FLOMAX) 0.4 MG CAPS capsule Take 1 capsule (0.4 mg total) by mouth daily. (Patient not taking: Reported on 12/15/2019) 30 capsule 2  Current Facility-Administered Medications on File Prior to Visit  Medication Dose Route Frequency Provider Last Rate Last Admin  . 0.9 %  sodium chloride infusion  500 mL Intravenous Once Ladene Artist, MD         PAST SURGICAL HISTORY Past Surgical History:  Procedure Laterality Date  . WISDOM TOOTH EXTRACTION     As a teen    FAMILY HISTORY: Family History  Problem Relation Age of Onset  . Diabetes Other        1st degree relative  . Hyperlipidemia Other   . Hypertension Other   . Heart disease Maternal Grandfather   . Heart disease  Paternal Grandfather   . Heart disease Maternal Uncle   . Colon cancer Neg Hx     SOCIAL HISTORY:  reports that he has been smoking cigarettes. He has a 40.00 pack-year smoking history. He has never used smokeless tobacco. He reports current alcohol use. He reports that he does not use drugs.  PERFORMANCE STATUS: The patient's performance status is 1 - Symptomatic but completely ambulatory  PHYSICAL EXAM: Most Recent Vital Signs: There were no vitals taken for this visit. There were no vitals taken for this visit.  General Appearance:    Alert, cooperative, no distress, appears stated age  Head:    Normocephalic, without obvious abnormality, atraumatic  Eyes:    PERRL, conjunctiva/corneas clear, EOM's intact, fundi    benign, both eyes             Throat:   Lips, mucosa, and tongue normal; teeth and gums normal  Neck:   Supple, symmetrical, trachea midline, no adenopathy;       thyroid:  No enlargement/tenderness/nodules; no carotid   bruit or JVD  Back:     Symmetric, no curvature, ROM normal, no CVA tenderness  Lungs:     Clear to auscultation bilaterally, respirations unlabored  Chest wall:    No tenderness or deformity  Heart:    Regular rate and rhythm, S1 and S2 normal, no murmur, rub   or gallop  Abdomen:     Soft, non-tender, bowel sounds active all four quadrants,    no masses, no organomegaly        Extremities:   Extremities normal, atraumatic, no cyanosis or edema  Pulses:   2+ and symmetric all extremities  Skin:   Skin color, texture, turgor normal, no rashes or lesions  Lymph nodes:   Cervical, supraclavicular, and axillary nodes normal  Neurologic:   CNII-XII intact. Normal strength, sensation and reflexes      throughout    LABORATORY DATA:  Results for orders placed or performed in visit on 12/20/19 (from the past 48 hour(s))  CBC with Differential ([facility] Only)     Status: None   Collection Time: 12/20/19  8:37 AM  Result Value Ref Range   WBC  Count 7.1 4.0 - 10.5 K/uL   RBC 5.58 4.22 - 5.81 MIL/uL   Hemoglobin 16.2 13.0 - 17.0 g/dL   HCT 46.9 39.0 - 52.0 %   MCV 84.1 80.0 - 100.0 fL   MCH 29.0 26.0 - 34.0 pg   MCHC 34.5 30.0 - 36.0 g/dL   RDW 13.6 11.5 - 15.5 %   Platelet Count 262 150 - 400 K/uL   nRBC 0.0 0.0 - 0.2 %   Neutrophils Relative % 72 %   Neutro Abs 5.1 1.7 - 7.7 K/uL   Lymphocytes Relative 18 %   Lymphs Abs 1.3 0.7 -  4.0 K/uL   Monocytes Relative 7 %   Monocytes Absolute 0.5 0.1 - 1.0 K/uL   Eosinophils Relative 2 %   Eosinophils Absolute 0.1 0.0 - 0.5 K/uL   Basophils Relative 1 %   Basophils Absolute 0.1 0.0 - 0.1 K/uL   Immature Granulocytes 0 %   Abs Immature Granulocytes 0.03 0.00 - 0.07 K/uL    Comment: Performed at [facility] at [facility], [address] [place], [place] [address]      RADIOGRAPHY: No results found.     PATHOLOGY: None  ASSESSMENT/PLAN: Mr. Hierholzer is a very pleasant 64 yo caucasian gentleman with history of one episodes of left lower extremity DVT diagnosed in 2018 and treated with 6 months of Xarelto. He has had issues with postphlebitic swelling and pain that seem to have corrected with the use of a compression stocking.  His work up for PE or DVT has been negative. The mildly elevated d-dimer could be related to other issues such as inflammation or smoking. It can also indicated potential for malignancy and we did recommend he follow-up regularly with his PCP for routine chest CT, colonoscopy and PSA checks. He understands that as a smoker he at higher risk of developing lung, colon and prostate cancers.  He will start taking 1 baby aspirin daily.  At this time we do not need to se him back in our office. He can certainly follow-up as needed for any future heme/onc issues.   All questions were answered and he is in agreement with the plan. He will contact our office with any questions or concerns.  He was discussed with and also seen by Dr. Maylon Peppers  and he is in agreement with the aforementioned.   Laverna Peace, NP   Addendum:  I interviewed and examined the patient.  I reviewed the patient's records extensively as well as NP Zury Fazzino's note, and agree with the plan as detailed.   In summary, patient was diagnosed with left lower extremity DVT in 2018, for which she completed 6 months of Xarelto.  Since then, he has had some postphlebitic syndrome, including swelling, which improved with compression stockings.  Due to his shortness of breath, CTA chest and Doppler were done (images of which I independently reviewed), which were negative for PTE and DVT, respectively.  However, due to a mildly elevated D-dimer, patient was referred to hematology for further evaluation.  I discussed with the patient some of the potential causes of elevated D-dimer, including venous thromboembolic disease, DIC, infection/inflammation, surgery/trauma, and malignancy, among many other causes.  The presence of mildly elevated D-dimer alone does not constitute venous or thromboembolism, and is not an indication for anticoagulation.  In light of the patient's extensive tobacco use, he is at increased risk for lung cancer, and therefore I strongly encouraged the patient to have annual lung cancer screening with his PCP.  In addition, he had a colonoscopy 2 years ago, which was reportedly unremarkable.  Following, he is scheduled to discuss with his PCP regarding the benefit of prostate cancer screening.  Patient can continue follow-up with his PCP for routine health management, and return to hematology as needed.  Tish Men, MD 12/20/2019 4:10 PM

## 2019-12-21 ENCOUNTER — Telehealth: Payer: Self-pay | Admitting: Family

## 2019-12-21 LAB — PROTEIN S, TOTAL: Protein S Ag, Total: 83 % (ref 60–150)

## 2019-12-21 LAB — HOMOCYSTEINE: Homocysteine: 19.9 umol/L — ABNORMAL HIGH (ref 0.0–17.2)

## 2019-12-21 LAB — PROTEIN C ACTIVITY: Protein C Activity: 83 % (ref 73–180)

## 2019-12-21 LAB — PROTEIN S ACTIVITY: Protein S Activity: 103 % (ref 63–140)

## 2019-12-21 LAB — LUPUS ANTICOAGULANT PANEL
DRVVT: 31 s (ref 0.0–47.0)
PTT Lupus Anticoagulant: 31 s (ref 0.0–51.9)

## 2019-12-21 LAB — PROTEIN C, TOTAL: Protein C, Total: 75 % (ref 60–150)

## 2019-12-21 NOTE — Telephone Encounter (Signed)
No los 1/26

## 2019-12-22 ENCOUNTER — Ambulatory Visit: Payer: Managed Care, Other (non HMO) | Admitting: Family

## 2019-12-22 ENCOUNTER — Other Ambulatory Visit: Payer: Managed Care, Other (non HMO)

## 2019-12-22 LAB — CARDIOLIPIN ANTIBODIES, IGG, IGM, IGA
Anticardiolipin IgA: <9 APL U/mL (ref 0–11)
Anticardiolipin IgG: <9 GPL U/mL (ref 0–14)
Anticardiolipin IgM: 38 MPL U/mL — ABNORMAL HIGH (ref 0–12)

## 2019-12-23 LAB — BETA-2-GLYCOPROTEIN I ABS, IGG/M/A
Beta-2 Glyco I IgG: <9 GPI IgG units (ref 0–20)
Beta-2-Glycoprotein I IgA: <9 GPI IgA units (ref 0–25)
Beta-2-Glycoprotein I IgM: <9 GPI IgM units (ref 0–32)

## 2019-12-23 LAB — PROTHROMBIN GENE MUTATION

## 2019-12-23 LAB — FACTOR 5 LEIDEN

## 2019-12-26 ENCOUNTER — Telehealth: Payer: Self-pay | Admitting: Family

## 2019-12-26 NOTE — Telephone Encounter (Signed)
I was able to speak with Leslie Harrington and go over his lab work from last week. He verbalized understanding and will continue his aspirin as usual. He states that he had some dizziness and numbness and tingling in his leg. He had no blurred/loss of vision, syncope or facial numbness or drooping. He will contact his PCP and Cardiologist both of whom he has scheduled appointment with next week. He is also scheduled for a stress test next week as well. No questions at this time. We will plan to see him again in another 3 months.

## 2019-12-26 NOTE — Telephone Encounter (Signed)
No answer. I left a call back number to go over his lab work and follow-up in 3 months. He will continue his daily aspirin.

## 2019-12-29 ENCOUNTER — Telehealth (HOSPITAL_COMMUNITY): Payer: Self-pay

## 2019-12-29 NOTE — Telephone Encounter (Signed)
Instructions left on the patient's answering machine. Asked to call back with any questions. S.Unique Searfoss EMTP 

## 2020-01-02 ENCOUNTER — Other Ambulatory Visit: Payer: Self-pay

## 2020-01-03 ENCOUNTER — Ambulatory Visit (INDEPENDENT_AMBULATORY_CARE_PROVIDER_SITE_OTHER): Payer: Managed Care, Other (non HMO) | Admitting: Family Medicine

## 2020-01-03 ENCOUNTER — Ambulatory Visit (HOSPITAL_COMMUNITY): Payer: Managed Care, Other (non HMO) | Attending: Cardiovascular Disease

## 2020-01-03 ENCOUNTER — Encounter: Payer: Self-pay | Admitting: Family Medicine

## 2020-01-03 VITALS — BP 154/74 | HR 92 | Temp 98.2°F | Ht 73.0 in | Wt 195.0 lb

## 2020-01-03 VITALS — Ht 72.0 in | Wt 191.0 lb

## 2020-01-03 DIAGNOSIS — R531 Weakness: Secondary | ICD-10-CM | POA: Insufficient documentation

## 2020-01-03 DIAGNOSIS — R0789 Other chest pain: Secondary | ICD-10-CM | POA: Diagnosis not present

## 2020-01-03 DIAGNOSIS — R002 Palpitations: Secondary | ICD-10-CM

## 2020-01-03 DIAGNOSIS — Z720 Tobacco use: Secondary | ICD-10-CM

## 2020-01-03 DIAGNOSIS — F341 Dysthymic disorder: Secondary | ICD-10-CM

## 2020-01-03 DIAGNOSIS — R030 Elevated blood-pressure reading, without diagnosis of hypertension: Secondary | ICD-10-CM | POA: Diagnosis not present

## 2020-01-03 DIAGNOSIS — F419 Anxiety disorder, unspecified: Secondary | ICD-10-CM | POA: Diagnosis not present

## 2020-01-03 LAB — MYOCARDIAL PERFUSION IMAGING
LV dias vol: 81 mL (ref 62–150)
LV sys vol: 29 mL
Peak HR: 94 {beats}/min
Rest HR: 60 {beats}/min
SDS: 0
SRS: 0
SSS: 0
TID: 1.14

## 2020-01-03 MED ORDER — REGADENOSON 0.4 MG/5ML IV SOLN
0.4000 mg | Freq: Once | INTRAVENOUS | Status: AC
  Administered 2020-01-03: 0.4 mg via INTRAVENOUS

## 2020-01-03 MED ORDER — PAROXETINE HCL 10 MG PO TABS: 10.0000 mg | ORAL_TABLET | Freq: Every day | ORAL | 0 refills | Status: DC

## 2020-01-03 MED ORDER — TECHNETIUM TC 99M TETROFOSMIN IV KIT
10.4000 | PACK | Freq: Once | INTRAVENOUS | Status: AC | PRN
  Administered 2020-01-03: 10.4 via INTRAVENOUS
  Filled 2020-01-03: qty 11

## 2020-01-03 MED ORDER — TECHNETIUM TC 99M TETROFOSMIN IV KIT
31.7000 | PACK | Freq: Once | INTRAVENOUS | Status: AC | PRN
  Administered 2020-01-03: 31.7 via INTRAVENOUS
  Filled 2020-01-03: qty 32

## 2020-01-03 NOTE — Progress Notes (Signed)
Established Patient Office Visit  Subjective:  Patient ID: Leslie Harrington, male    DOB: February 09, 1956  Age: 64 y.o. MRN: [id number]  CC:  Chief Complaint  Patient presents with  . Follow-up    2 week follow up on DVT, no concerns pt states that his pain have subsided, no swelling.     HPI Leslie Harrington presents for follow-up of his phlebitis with near DVT.  He has been using a compression stocking at work.  Status post exercise stress test today that he believes went well.  He has no family history of DVT or pulmonary embolism that he is aware of.  There is a question of Leiden factor V.  He is currently taking a low-dose aspirin.  Paxil is working well for him.  It has helped anxiety a great deal.  He is sleeping better on it.  Blood pressure has been elevated more recently.  Past Medical History:  Diagnosis Date  . Acute bronchitis 09/17/2007  . ALLERGIC RHINITIS 05/10/2010  . ANXIETY DEPRESSION 08/08/2010  . ASTHMA, CHILDHOOD 09/17/2007  . CONJUNCTIVITIS, ACUTE, BILATERAL 05/10/2010  . ERECTILE DYSFUNCTION 09/17/2007  . HYPERLIPIDEMIA 08/08/2010  . OLECRANON BURSITIS, LEFT 08/08/2010  . SINUSITIS- ACUTE-NOS 02/07/2009  . Unspecified hearing loss 02/07/2009  . Wheezing 01/04/2010    Past Surgical History:  Procedure Laterality Date  . WISDOM TOOTH EXTRACTION     As a teen    Family History  Problem Relation Age of Onset  . Diabetes Other        1st degree relative  . Hyperlipidemia Other   . Hypertension Other   . Heart disease Maternal Grandfather   . Heart disease Paternal Grandfather   . Heart disease Maternal Uncle   . Colon cancer Neg Hx     Social History   Socioeconomic History  . Marital status: Significant Other    Spouse name: Not on file  . Number of children: 2  . Years of education: Not on file  . Highest education level: Not on file  Occupational History  . Not on file  Tobacco Use  . Smoking status: Current Every Day Smoker    Packs/day: 1.00    Years: 40.00    Pack years: 40.00    Types: Cigarettes  . Smokeless tobacco: Never Used  Substance and Sexual Activity  . Alcohol use: Yes    Comment: occ beer  . Drug use: No  . Sexual activity: Not Currently  Other Topics Concern  . Not on file  Social History Narrative  . Not on file   Social Determinants of Health   Financial Resource Strain:   . Difficulty of Paying Living Expenses: Not on file  Food Insecurity:   . Worried About Charity fundraiser in the Last Year: Not on file  . Ran Out of Food in the Last Year: Not on file  Transportation Needs:   . Lack of Transportation (Medical): Not on file  . Lack of Transportation (Non-Medical): Not on file  Physical Activity:   . Days of Exercise per Week: Not on file  . Minutes of Exercise per Session: Not on file  Stress:   . Feeling of Stress : Not on file  Social Connections:   . Frequency of Communication with Friends and Family: Not on file  . Frequency of Social Gatherings with Friends and Family: Not on file  . Attends Religious Services: Not on file  . Active Member of Clubs or Organizations:  Not on file  . Attends Archivist Meetings: Not on file  . Marital Status: Not on file  Intimate Partner Violence:   . Fear of Current or Ex-Partner: Not on file  . Emotionally Abused: Not on file  . Physically Abused: Not on file  . Sexually Abused: Not on file    Outpatient Medications Prior to Visit  Medication Sig Dispense Refill  . PARoxetine (PAXIL) 10 MG tablet Take 1 tablet (10 mg total) by mouth daily. 30 tablet 0  . sildenafil (REVATIO) 20 MG tablet May take 1-5 pills 45 minutes prior daily as needed. (Patient not taking: Reported on 12/15/2019) 50 tablet 1  . tamsulosin (FLOMAX) 0.4 MG CAPS capsule Take 1 capsule (0.4 mg total) by mouth daily. (Patient not taking: Reported on 12/15/2019) 30 capsule 2   No facility-administered medications prior to visit.    No Known Allergies  ROS Review of  Systems  Constitutional: Negative.   HENT: Negative.   Respiratory: Negative.   Cardiovascular: Negative.  Negative for palpitations and leg swelling.  Gastrointestinal: Negative.   Skin: Negative for pallor and rash.  Neurological: Negative for headaches.  Hematological: Does not bruise/bleed easily.   Depression screen [facility] 2/9 01/03/2020 12/15/2019  Decreased Interest 0 0  Down, Depressed, Hopeless 0 1  PHQ - 2 Score 0 1  Altered sleeping 0 0  Tired, decreased energy 1 1  Change in appetite 0 0  Feeling bad or failure about yourself  0 0  Trouble concentrating 0 0  Moving slowly or fidgety/restless 0 0  Suicidal thoughts 0 0  PHQ-9 Score 1 2  Difficult doing work/chores Not difficult at all Not difficult at all      Objective:    Physical Exam  Constitutional: He appears well-developed and well-nourished. No distress.  HENT:  Head: Normocephalic and atraumatic.  Right Ear: External ear normal.  Left Ear: External ear normal.  Eyes: Conjunctivae are normal. Right eye exhibits no discharge. Left eye exhibits no discharge. No scleral icterus.  Neck: No JVD present. No tracheal deviation present.  Cardiovascular: Normal rate, regular rhythm and normal heart sounds.  Pulmonary/Chest: Effort normal. He has decreased breath sounds.  Skin: He is not diaphoretic.  Psychiatric: He has a normal mood and affect. His speech is normal and behavior is normal.    BP (!) 154/74   Pulse 92   Temp 98.2 F (36.8 C) (Tympanic)   Ht 6\' 1"  (1.854 m)   Wt 195 lb (88.5 kg)   SpO2 98%   BMI 25.73 kg/m  Wt Readings from Last 3 Encounters:  01/03/20 195 lb (88.5 kg)  01/03/20 191 lb (86.6 kg)  12/20/19 191 lb (86.6 kg)   BP Readings from Last 3 Encounters:  01/03/20 (!) 154/74  12/20/19 (!) 163/87  12/15/19 (!) 168/92    Health Maintenance Due  Topic Date Due  . Hepatitis C Screening  1956/11/10  . HIV Screening  11/21/1973  . TETANUS/TDAP  11/21/1977    There are no  preventive care reminders to display for this patient.  Lab Results  Component Value Date   TSH 1.470 12/14/2019   Lab Results  Component Value Date   WBC 7.1 12/20/2019   HGB 16.2 12/20/2019   HCT 46.9 12/20/2019   MCV 84.1 12/20/2019   PLT 262 12/20/2019   Lab Results  Component Value Date   NA 135 12/20/2019   K 4.0 12/20/2019   CO2 25 12/20/2019   GLUCOSE 98  12/20/2019   BUN 14 12/20/2019   CREATININE 1.04 12/20/2019   BILITOT 0.7 12/20/2019   ALKPHOS 71 12/20/2019   AST 11 (L) 12/20/2019   ALT 10 12/20/2019   PROT 6.9 12/20/2019   ALBUMIN 4.2 12/20/2019   CALCIUM 9.7 12/20/2019   ANIONGAP 8 12/20/2019   GFR 109.01 12/25/2011   Lab Results  Component Value Date   CHOL 184 08/08/2010   Lab Results  Component Value Date   HDL 39.80 08/08/2010   Lab Results  Component Value Date   LDLCALC 131 (H) 08/08/2010   Lab Results  Component Value Date   TRIG 67.0 08/08/2010   Lab Results  Component Value Date   CHOLHDL 5 08/08/2010   No results found for: HGBA1C    Assessment & Plan:   Problem List Items Addressed This Visit      Other   Dysthymia   Relevant Medications   PARoxetine (PAXIL) 10 MG tablet   Tobacco use - Primary   Elevated BP without diagnosis of hypertension   Anxiety   Relevant Medications   PARoxetine (PAXIL) 10 MG tablet      Meds ordered this encounter  Medications  . PARoxetine (PAXIL) 10 MG tablet    Sig: Take 1 tablet (10 mg total) by mouth daily.    Dispense:  90 tablet    Refill:  0    Follow-up: Return in about 2 months (around 03/02/2020), or for a physical exam and recheck of blood pressure..  Patient will check his blood pressure as he is able over the next few months.  He was given information on preventing hypertension.  He was also given information on steps to quit smoking and Chantix.  He is not interested in trying it at this time.  Stressed the importance of quitting smoking for his overall health and prevention  of future DVTs.  He had developed some lightheadedness while he was taking the Flomax and elected to discontinue it.  I told him that it would be okay to reintroduce it again at his discretion.  He will follow-up in the next few months for a physical.  Libby Maw, MD

## 2020-01-03 NOTE — Patient Instructions (Signed)
Steps to Quit Smoking Smoking tobacco is the leading cause of preventable death. It can affect almost every organ in the body. Smoking puts you and those around you at risk for developing many serious chronic diseases. Quitting smoking can be difficult, but it is one of the best things that you can do for your health. It is never too late to quit. How do I get ready to quit? When you decide to quit smoking, create a plan to help you succeed. Before you quit:  Pick a date to quit. Set a date within the next 2 weeks to give you time to prepare.  Write down the reasons why you are quitting. Keep this list in places where you will see it often.  Tell your family, friends, and co-workers that you are quitting. Support from your loved ones can make quitting easier.  Talk with your health care provider about your options for quitting smoking.  Find out what treatment options are covered by your health insurance.  Identify people, places, things, and activities that make you want to smoke (triggers). Avoid them. What first steps can I take to quit smoking?  Throw away all cigarettes at home, at work, and in your car.  Throw away smoking accessories, such as ashtrays and lighters.  Clean your car. Make sure to empty the ashtray.  Clean your home, including curtains and carpets. What strategies can I use to quit smoking? Talk with your health care provider about combining strategies, such as taking medicines while you are also receiving in-person counseling. Using these two strategies together makes you more likely to succeed in quitting than if you used either strategy on its own.  If you are pregnant or breastfeeding, talk with your health care provider about finding counseling or other support strategies to quit smoking. Do not take medicine to help you quit smoking unless your health care provider tells you to do so. To quit smoking: Quit right away  Quit smoking completely, instead of  gradually reducing how much you smoke over a period of time. Research shows that stopping smoking right away is more successful than gradually quitting.  Attend in-person counseling to help you build problem-solving skills. You are more likely to succeed in quitting if you attend counseling sessions regularly. Even short sessions of 10 minutes can be effective. Take medicine You may take medicines to help you quit smoking. Some medicines require a prescription and some you can purchase over-the-counter. Medicines may have nicotine in them to replace the nicotine in cigarettes. Medicines may:  Help to stop cravings.  Help to relieve withdrawal symptoms. Your health care provider may recommend:  Nicotine patches, gum, or lozenges.  Nicotine inhalers or sprays.  Non-nicotine medicine that is taken by mouth. Find resources Find resources and support systems that can help you to quit smoking and remain smoke-free after you quit. These resources are most helpful when you use them often. They include:  Online chats with a counselor.  Telephone quitlines.  Printed self-help materials.  Support groups or group counseling.  Text messaging programs.  Mobile phone apps or applications. Use apps that can help you stick to your quit plan by providing reminders, tips, and encouragement. There are many free apps for mobile devices as well as websites. Examples include Quit Guide from the CDC and smokefree.gov What things can I do to make it easier to quit?   Reach out to your family and friends for support and encouragement. Call telephone quitlines (1-800-QUIT-NOW), reach   out to support groups, or work with a Social worker for support.  Ask people who smoke to avoid smoking around you.  Avoid places that trigger you to smoke, such as bars, parties, or smoke-break areas at work.  Spend time with people who do not smoke.  Lessen the stress in your life. Stress can be a smoking trigger for some  people. To lessen stress, try: ? Exercising regularly. ? Doing deep-breathing exercises. ? Doing yoga. ? Meditating. ? Performing a body scan. This involves closing your eyes, scanning your body from head to toe, and noticing which parts of your body are particularly tense. Try to relax the muscles in those areas. How will I feel when I quit smoking? Day 1 to 3 weeks Within the first 24 hours of quitting smoking, you may start to feel withdrawal symptoms. These symptoms are usually most noticeable 2-3 days after quitting, but they usually do not last for more than 2-3 weeks. You may experience these symptoms:  Mood swings.  Restlessness, anxiety, or irritability.  Trouble concentrating.  Dizziness.  Strong cravings for sugary foods and nicotine.  Mild weight gain.  Constipation.  Nausea.  Coughing or a sore throat.  Changes in how the medicines that you take for unrelated issues work in your body.  Depression.  Trouble sleeping (insomnia). Week 3 and afterward After the first 2-3 weeks of quitting, you may start to notice more positive results, such as:  Improved sense of smell and taste.  Decreased coughing and sore throat.  Slower heart rate.  Lower blood pressure.  Clearer skin.  The ability to breathe more easily.  Fewer sick days. Quitting smoking can be very challenging. Do not get discouraged if you are not successful the first time. Some people need to make many attempts to quit before they achieve long-term success. Do your best to stick to your quit plan, and talk with your health care provider if you have any questions or concerns. Summary  Smoking tobacco is the leading cause of preventable death. Quitting smoking is one of the best things that you can do for your health.  When you decide to quit smoking, create a plan to help you succeed.  Quit smoking right away, not slowly over a period of time.  When you start quitting, seek help from your  health care provider, family, or friends. This information is not intended to replace advice given to you by your health care provider. Make sure you discuss any questions you have with your health care provider. Document Revised: 08/05/2019 Document Reviewed: 01/29/2019 Elsevier Patient Education  [place]. Varenicline oral tablets What is this medicine? VARENICLINE (var e NI kleen) is used to help people quit smoking. It is used with a patient support program recommended by your physician. This medicine may be used for other purposes; ask your health care provider or pharmacist if you have questions. COMMON BRAND NAME(S): Chantix What should I tell my health care provider before I take this medicine? They need to know if you have any of these conditions:  heart disease  if you often drink alcohol  kidney disease  mental illness  on hemodialysis  seizures  history of stroke  suicidal thoughts, plans, or attempt; a previous suicide attempt by you or a family member  an unusual or allergic reaction to varenicline, other medicines, foods, dyes, or preservatives  pregnant or trying to get pregnant  breast-feeding How should I use this medicine? Take this medicine by mouth after  eating. Take with a full glass of water. Follow the directions on the prescription label. Take your doses at regular intervals. Do not take your medicine more often than directed. There are 3 ways you can use this medicine to help you quit smoking; talk to your health care professional to decide which plan is right for you: 1) you can choose a quit date and start this medicine 1 week before the quit date, or, 2) you can start taking this medicine before you choose a quit date, and then pick a quit date between day 8 and 35 days of treatment, or, 3) if you are not sure that you are able or willing to quit smoking right away, start taking this medicine and slowly decrease the amount you smoke as  directed by your health care professional with the goal of being cigarette-free by week 12 of treatment. Stick to your plan; ask about support groups or other ways to help you remain cigarette-free. If you are motivated to quit smoking and did not succeed during a previous attempt with this medicine for reasons other than side effects, or if you returned to smoking after this treatment, speak with your health care professional about whether another course of this medicine may be right for you. A special MedGuide will be given to you by the pharmacist with each prescription and refill. Be sure to read this information carefully each time. Talk to your pediatrician regarding the use of this medicine in children. This medicine is not approved for use in children. Overdosage: If you think you have taken too much of this medicine contact a poison control center or emergency room at once. NOTE: This medicine is only for you. Do not share this medicine with others. What if I miss a dose? If you miss a dose, take it as soon as you can. If it is almost time for your next dose, take only that dose. Do not take double or extra doses. What may interact with this medicine?  alcohol  insulin  other medicines used to help people quit smoking  theophylline  warfarin This list may not describe all possible interactions. Give your health care provider a list of all the medicines, herbs, non-prescription drugs, or dietary supplements you use. Also tell them if you smoke, drink alcohol, or use illegal drugs. Some items may interact with your medicine. What should I watch for while using this medicine? It is okay if you do not succeed at your attempt to quit and have a cigarette. You can still continue your quit attempt and keep using this medicine as directed. Just throw away your cigarettes and get back to your quit plan. Talk to your health care provider before using other treatments to quit smoking. Using this  medicine with other treatments to quit smoking may increase the risk for side effects compared to using a treatment alone. You may get drowsy or dizzy. Do not drive, use machinery, or do anything that needs mental alertness until you know how this medicine affects you. Do not stand or sit up quickly, especially if you are an older patient. This reduces the risk of dizzy or fainting spells. Decrease the number of alcoholic beverages that you drink during treatment with this medicine until you know if this medicine affects your ability to tolerate alcohol. Some people have experienced increased drunkenness (intoxication), unusual or sometimes aggressive behavior, or no memory of things that have happened (amnesia) during treatment with this medicine. Sleepwalking can happen during  treatment with this medicine, and can sometimes lead to behavior that is harmful to you, other people, or property. Stop taking this medicine and tell your doctor if you start sleepwalking or have other unusual sleep-related activity. After taking this medicine, you may get up out of bed and do an activity that you do not know you are doing. The next morning, you may have no memory of this. Activities include driving a car ("sleep-driving"), making and eating food, talking on the phone, sexual activity, and sleep-walking. Serious injuries have occurred. Stop the medicine and call your doctor right away if you find out you have done any of these activities. Do not take this medicine if you have used alcohol that evening. Do not take it if you have taken another medicine for sleep. The risk of doing these sleep-related activities is higher. Patients and their families should watch out for new or worsening depression or thoughts of suicide. Also watch out for sudden changes in feelings such as feeling anxious, agitated, panicky, irritable, hostile, aggressive, impulsive, severely restless, overly excited and hyperactive, or not being able  to sleep. If this happens, call your health care professional. If you have diabetes and you quit smoking, the effects of insulin may be increased and you may need to reduce your insulin dose. Check with your doctor or health care professional about how you should adjust your insulin dose. What side effects may I notice from receiving this medicine? Side effects that you should report to your doctor or health care professional as soon as possible:  allergic reactions like skin rash, itching or hives, swelling of the face, lips, tongue, or throat  acting aggressive, being angry or violent, or acting on dangerous impulses  breathing problems  changes in emotions or moods  chest pain or chest tightness  feeling faint or lightheaded, falls  hallucination, loss of contact with reality  mouth sores  redness, blistering, peeling or loosening of the skin, including inside the mouth  signs and symptoms of a stroke like changes in vision; confusion; trouble speaking or understanding; severe headaches; sudden numbness or weakness of the face, arm or leg; trouble walking; dizziness; loss of balance or coordination  seizures  sleepwalking  suicidal thoughts or other mood changes Side effects that usually do not require medical attention (report to your doctor or health care professional if they continue or are bothersome):  constipation  gas  headache  nausea, vomiting  strange dreams  trouble sleeping This list may not describe all possible side effects. Call your doctor for medical advice about side effects. You may report side effects to FDA at 1-800-FDA-1088. Where should I keep my medicine? Keep out of the reach of children. Store at room temperature between 15 and 30 degrees C (59 and 86 degrees F). Throw away any unused medicine after the expiration date. NOTE: This sheet is a summary. It may not cover all possible information. If you have questions about this medicine, talk  to your doctor, pharmacist, or health care provider.  2020 Elsevier/Gold Standard (2018-10-29 14:27:36)  Preventing Hypertension Hypertension, commonly called high blood pressure, is when the force of blood pumping through the arteries is too strong. Arteries are blood vessels that carry blood from the heart throughout the body. Over time, hypertension can damage the arteries and decrease blood flow to important parts of the body, including the brain, heart, and kidneys. Often, hypertension does not cause symptoms until blood pressure is very high. For this reason, it is  important to have your blood pressure checked on a regular basis. Hypertension can often be prevented with diet and lifestyle changes. If you already have hypertension, you can control it with diet and lifestyle changes, as well as medicine. What nutrition changes can be made? Maintain a healthy diet. This includes:  Eating less salt (sodium). Ask your health care provider how much sodium is safe for you to have. The general recommendation is to consume less than 1 tsp (2,300 mg) of sodium a day. ? Do not add salt to your food. ? Choose low-sodium options when grocery shopping and eating out.  Limiting fats in your diet. You can do this by eating low-fat or fat-free dairy products and by eating less red meat.  Eating more fruits, vegetables, and whole grains. Make a goal to eat: ? 1-2 cups of fresh fruits and vegetables each day. ? 3-4 servings of whole grains each day.  Avoiding foods and beverages that have added sugars.  Eating fish that contain healthy fats (omega-3 fatty acids), such as mackerel or salmon. If you need help putting together a healthy eating plan, try the DASH diet. This diet is high in fruits, vegetables, and whole grains. It is low in sodium, red meat, and added sugars. DASH stands for Dietary Approaches to Stop Hypertension. What lifestyle changes can be made?   Lose weight if you are overweight.  Losing just 3?5% of your body weight can help prevent or control hypertension. ? For example, if your present weight is 200 lb (91 kg), a loss of 3-5% of your weight means losing 6-10 lb (2.7-4.5 kg). ? Ask your health care provider to help you with a diet and exercise plan to safely lose weight.  Get enough exercise. Do at least 150 minutes of moderate-intensity exercise each week. ? You could do this in short exercise sessions several times a day, or you could do longer exercise sessions a few times a week. For example, you could take a brisk 10-minute walk or bike ride, 3 times a day, for 5 days a week.  Find ways to reduce stress, such as exercising, meditating, listening to music, or taking a yoga class. If you need help reducing stress, ask your health care provider.  Do not smoke. This includes e-cigarettes. Chemicals in tobacco and nicotine products raise your blood pressure each time you smoke. If you need help quitting, ask your health care provider.  Avoid alcohol. If you drink alcohol, limit alcohol intake to no more than 1 drink a day for nonpregnant women and 2 drinks a day for men. One drink equals 12 oz of beer, 5 oz of wine, or 1 oz of hard liquor. Why are these changes important? Diet and lifestyle changes can help you prevent hypertension, and they may make you feel better overall and improve your quality of life. If you have hypertension, making these changes will help you control it and help prevent major complications, such as:  Hardening and narrowing of arteries that supply blood to: ? Your heart. This can cause a heart attack. ? Your brain. This can cause a stroke. ? Your kidneys. This can cause kidney failure.  Stress on your heart muscle, which can cause heart failure. What can I do to lower my risk?  Work with your health care provider to make a hypertension prevention plan that works for you. Follow your plan and keep all follow-up visits as told by your health  care provider.  Learn how to check  your blood pressure at home. Make sure that you know your personal target blood pressure, as told by your health care provider. How is this treated? In addition to diet and lifestyle changes, your health care provider may recommend medicines to help lower your blood pressure. You may need to try a few different medicines to find what works best for you. You also may need to take more than one medicine. Take over-the-counter and prescription medicines only as told by your health care provider. Where to find support Your health care provider can help you prevent hypertension and help you keep your blood pressure at a healthy level. Your local hospital or your community may also provide support services and prevention programs. The American Heart Association offers an online support network at: CheapBootlegs.com.cy Where to find more information Learn more about hypertension from:  [place], Lung, and Blood Institute: ElectronicHangman.is  Centers for Disease Control and Prevention: https://ingram.com/  American Academy of Family Physicians: http://familydoctor.org/familydoctor/en/diseases-conditions/high-blood-pressure.printerview.all.html Learn more about the DASH diet from:  [place], Lung, and [place]: https://www.reyes.com/ Contact a health care provider if:  You think you are having a reaction to medicines you have taken.  You have recurrent headaches or feel dizzy.  You have swelling in your ankles.  You have trouble with your vision. Summary  Hypertension often does not cause any symptoms until blood pressure is very high. It is important to get your blood pressure checked regularly.  Diet and lifestyle changes are the most important steps in preventing hypertension.  By keeping your blood pressure in a healthy range, you can  prevent complications like heart attack, heart failure, stroke, and kidney failure.  Work with your health care provider to make a hypertension prevention plan that works for you. This information is not intended to replace advice given to you by your health care provider. Make sure you discuss any questions you have with your health care provider. Document Revised: 03/04/2019 Document Reviewed: 07/21/2016 Elsevier Patient Education  2020 [employer].

## 2020-01-07 ENCOUNTER — Other Ambulatory Visit: Payer: Self-pay | Admitting: Family Medicine

## 2020-01-07 DIAGNOSIS — F341 Dysthymic disorder: Secondary | ICD-10-CM

## 2020-01-10 ENCOUNTER — Encounter: Payer: Self-pay | Admitting: Cardiology

## 2020-01-10 ENCOUNTER — Ambulatory Visit (INDEPENDENT_AMBULATORY_CARE_PROVIDER_SITE_OTHER): Payer: Managed Care, Other (non HMO) | Admitting: Cardiology

## 2020-01-10 ENCOUNTER — Other Ambulatory Visit: Payer: Self-pay

## 2020-01-10 VITALS — BP 138/88 | HR 72 | Ht 73.0 in | Wt 196.0 lb

## 2020-01-10 DIAGNOSIS — R0789 Other chest pain: Secondary | ICD-10-CM

## 2020-01-10 DIAGNOSIS — F17210 Nicotine dependence, cigarettes, uncomplicated: Secondary | ICD-10-CM | POA: Diagnosis not present

## 2020-01-10 NOTE — Progress Notes (Signed)
Cardiology Office Note:    Date:  01/10/2020   ID:  Leslie Harrington, DOB September 25, 1956, MRN [id number]  PCP:  Libby Maw, MD  Cardiologist:  Jenean Lindau, MD   Referring MD: Libby Maw,*    ASSESSMENT:    No diagnosis found. PLAN:    In order of problems listed above:  1. Primary prevention stressed with the patient.  Importance of compliance with diet and medication stressed and he vocalized understanding.  Importance of regular exercise stressed half an hour walking at least 5 days a week once his torso. 2. Results of stress test discussed with the patient at extensive length 3. Mixed dyslipidemia: He will get blood work done by his primary care physician in the next few days and bring it to me for review. 4. Cigarette smoker: I spent 5 minutes with the patient discussing solely about smoking. Smoking cessation was counseled. I suggested to the patient also different medications and pharmacological interventions. Patient is keen to try stopping on its own at this time. He will get back to me if he needs any further assistance in this matter. 5. Patient will be seen in follow-up appointment in 6 months or earlier if the patient has any concerns    Medication Adjustments/Labs and Tests Ordered: Current medicines are reviewed at length with the patient today.  Concerns regarding medicines are outlined above.  No orders of the defined types were placed in this encounter.  No orders of the defined types were placed in this encounter.    No chief complaint on file.    History of Present Illness:    Leslie Harrington is a 64 y.o. male with past medical history of smoking.  Patient was evaluated by me for chest discomfort and stress test was unremarkable.  Patient denies any problems at this time and takes care of activities of daily living.  No chest pain orthopnea or PND.  He has history of dyslipidemia.  At the time of my evaluation, the patient is alert  awake oriented and in no distress.  Past Medical History:  Diagnosis Date  . Acute bronchitis 09/17/2007  . ALLERGIC RHINITIS 05/10/2010  . ANXIETY DEPRESSION 08/08/2010  . ASTHMA, CHILDHOOD 09/17/2007  . CONJUNCTIVITIS, ACUTE, BILATERAL 05/10/2010  . ERECTILE DYSFUNCTION 09/17/2007  . HYPERLIPIDEMIA 08/08/2010  . OLECRANON BURSITIS, LEFT 08/08/2010  . SINUSITIS- ACUTE-NOS 02/07/2009  . Unspecified hearing loss 02/07/2009  . Wheezing 01/04/2010    Past Surgical History:  Procedure Laterality Date  . WISDOM TOOTH EXTRACTION     As a teen    Current Medications: Current Meds  Medication Sig  . aspirin EC 81 MG tablet Take 81 mg by mouth daily.  Marland Kitchen PARoxetine (PAXIL) 10 MG tablet TAKE 1 TABLET BY MOUTH EVERY DAY     Allergies:   Patient has no known allergies.   Social History   Socioeconomic History  . Marital status: Significant Other    Spouse name: Not on file  . Number of children: 2  . Years of education: Not on file  . Highest education level: Not on file  Occupational History  . Not on file  Tobacco Use  . Smoking status: Current Every Day Smoker    Packs/day: 1.00    Years: 40.00    Pack years: 40.00    Types: Cigarettes  . Smokeless tobacco: Never Used  Substance and Sexual Activity  . Alcohol use: Yes    Comment: occ beer  . Drug use:  No  . Sexual activity: Not Currently  Other Topics Concern  . Not on file  Social History Narrative  . Not on file   Social Determinants of Health   Financial Resource Strain:   . Difficulty of Paying Living Expenses: Not on file  Food Insecurity:   . Worried About Charity fundraiser in the Last Year: Not on file  . Ran Out of Food in the Last Year: Not on file  Transportation Needs:   . Lack of Transportation (Medical): Not on file  . Lack of Transportation (Non-Medical): Not on file  Physical Activity:   . Days of Exercise per Week: Not on file  . Minutes of Exercise per Session: Not on file  Stress:   . Feeling  of Stress : Not on file  Social Connections:   . Frequency of Communication with Friends and Family: Not on file  . Frequency of Social Gatherings with Friends and Family: Not on file  . Attends Religious Services: Not on file  . Active Member of Clubs or Organizations: Not on file  . Attends Archivist Meetings: Not on file  . Marital Status: Not on file     Family History: The patient's family history includes Diabetes in an other family member; Heart disease in his maternal grandfather, maternal uncle, and paternal grandfather; Hyperlipidemia in an other family member; Hypertension in an other family member. There is no history of Colon cancer.  ROS:   Please see the history of present illness.    All other systems reviewed and are negative.  EKGs/Labs/Other Studies Reviewed:    The following studies were reviewed today: Study Highlights    The left ventricular ejection fraction is normal (55-65%).  Nuclear stress EF: 65%.  There was no ST segment deviation noted during stress.  The study is normal.  This is a low risk study.   Normal resting and stress perfusion. No ischemia or infarction EF 65%       Recent Labs: 12/14/2019: TSH 1.470 12/20/2019: ALT 10; BUN 14; Creatinine 1.04; Hemoglobin 16.2; Platelet Count 262; Potassium 4.0; Sodium 135  Recent Lipid Panel    Component Value Date/Time   CHOL 184 08/08/2010 1154   TRIG 67.0 08/08/2010 1154   HDL 39.80 08/08/2010 1154   CHOLHDL 5 08/08/2010 1154   VLDL 13.4 08/08/2010 1154   LDLCALC 131 (H) 08/08/2010 1154    Physical Exam:    VS:  BP 138/88   Pulse 72   Ht 6\' 1"  (1.854 m)   Wt 196 lb (88.9 kg)   SpO2 98%   BMI 25.86 kg/m     Wt Readings from Last 3 Encounters:  01/10/20 196 lb (88.9 kg)  01/03/20 195 lb (88.5 kg)  01/03/20 191 lb (86.6 kg)     GEN: Patient is in no acute distress HEENT: Normal NECK: No JVD; No carotid bruits LYMPHATICS: No lymphadenopathy CARDIAC: Hear  sounds regular, 2/6 systolic murmur at the apex. RESPIRATORY:  Clear to auscultation without rales, wheezing or rhonchi  ABDOMEN: Soft, non-tender, non-distended MUSCULOSKELETAL:  No edema; No deformity  SKIN: Warm and dry NEUROLOGIC:  Alert and oriented x 3 PSYCHIATRIC:  Normal affect   Signed, Jenean Lindau, MD  01/10/2020 4:17 PM    [facility]

## 2020-01-10 NOTE — Patient Instructions (Signed)
Medication Instructions:  No medication changes *If you need a refill on your cardiac medications before your next appointment, please call your pharmacy*  Lab Work: No labs ordered If you have labs (blood work) drawn today and your tests are completely normal, you will receive your results only by: Marland Kitchen MyChart Message (if you have MyChart) OR . A paper copy in the mail If you have any lab test that is abnormal or we need to change your treatment, we will call you to review the results.  Testing/Procedures: None ordered  Follow-Up: At [facility], you and your health needs are our priority.  As part of our continuing mission to provide you with exceptional heart care, we have created designated Provider Care Teams.  These Care Teams include your primary Cardiologist (physician) and Advanced Practice Providers (APPs -  Physician Assistants and Nurse Practitioners) who all work together to provide you with the care you need, when you need it.  Your next appointment:   6 month(s)  The format for your next appointment:   In Person  Provider:   Jyl Heinz, MD  Other Instructions NA

## 2020-03-23 ENCOUNTER — Other Ambulatory Visit: Payer: Self-pay | Admitting: Family

## 2020-03-23 DIAGNOSIS — E7211 Homocystinuria: Secondary | ICD-10-CM

## 2020-03-23 DIAGNOSIS — I82402 Acute embolism and thrombosis of unspecified deep veins of left lower extremity: Secondary | ICD-10-CM

## 2020-03-23 DIAGNOSIS — D6851 Activated protein C resistance: Secondary | ICD-10-CM

## 2020-03-23 DIAGNOSIS — R7989 Other specified abnormal findings of blood chemistry: Secondary | ICD-10-CM

## 2020-03-26 ENCOUNTER — Inpatient Hospital Stay: Payer: Managed Care, Other (non HMO) | Attending: Family | Admitting: Family

## 2020-03-26 ENCOUNTER — Inpatient Hospital Stay: Payer: Managed Care, Other (non HMO)

## 2020-03-26 ENCOUNTER — Encounter: Payer: Self-pay | Admitting: Family

## 2020-03-26 ENCOUNTER — Other Ambulatory Visit: Payer: Self-pay

## 2020-03-26 VITALS — BP 172/87 | HR 78 | Temp 97.3°F | Resp 18 | Wt 196.0 lb

## 2020-03-26 DIAGNOSIS — D6851 Activated protein C resistance: Secondary | ICD-10-CM

## 2020-03-26 DIAGNOSIS — I82402 Acute embolism and thrombosis of unspecified deep veins of left lower extremity: Secondary | ICD-10-CM

## 2020-03-26 DIAGNOSIS — E7211 Homocystinuria: Secondary | ICD-10-CM | POA: Diagnosis not present

## 2020-03-26 DIAGNOSIS — Z86718 Personal history of other venous thrombosis and embolism: Secondary | ICD-10-CM | POA: Insufficient documentation

## 2020-03-26 DIAGNOSIS — R7989 Other specified abnormal findings of blood chemistry: Secondary | ICD-10-CM

## 2020-03-26 LAB — CMP (CANCER CENTER ONLY)
ALT: 9 U/L (ref 0–44)
AST: 12 U/L — ABNORMAL LOW (ref 15–41)
Albumin: 4.1 g/dL (ref 3.5–5.0)
Alkaline Phosphatase: 78 U/L (ref 38–126)
Anion gap: 8 (ref 5–15)
BUN: 20 mg/dL (ref 8–23)
CO2: 27 mmol/L (ref 22–32)
Calcium: 9.7 mg/dL (ref 8.9–10.3)
Chloride: 104 mmol/L (ref 98–111)
Creatinine: 1.16 mg/dL (ref 0.61–1.24)
GFR, Est AFR Am: >60 mL/min (ref >60)
GFR, Estimated: >60 mL/min (ref >60)
Glucose, Bld: 93 mg/dL (ref 70–99)
Potassium: 4.1 mmol/L (ref 3.5–5.1)
Sodium: 139 mmol/L (ref 135–145)
Total Bilirubin: 0.5 mg/dL (ref 0.3–1.2)
Total Protein: 6.7 g/dL (ref 6.5–8.1)

## 2020-03-26 LAB — CBC WITH DIFFERENTIAL (CANCER CENTER ONLY)
Abs Immature Granulocytes: 0.02 10*3/uL (ref 0.00–0.07)
Basophils Absolute: 0.1 10*3/uL (ref 0.0–0.1)
Basophils Relative: 1 %
Eosinophils Absolute: 0.2 10*3/uL (ref 0.0–0.5)
Eosinophils Relative: 2 %
HCT: 47.1 % (ref 39.0–52.0)
Hemoglobin: 15.8 g/dL (ref 13.0–17.0)
Immature Granulocytes: 0 %
Lymphocytes Relative: 29 %
Lymphs Abs: 2.2 10*3/uL (ref 0.7–4.0)
MCH: 29.1 pg (ref 26.0–34.0)
MCHC: 33.5 g/dL (ref 30.0–36.0)
MCV: 86.7 fL (ref 80.0–100.0)
Monocytes Absolute: 0.4 10*3/uL (ref 0.1–1.0)
Monocytes Relative: 6 %
Neutro Abs: 4.8 10*3/uL (ref 1.7–7.7)
Neutrophils Relative %: 62 %
Platelet Count: 232 10*3/uL (ref 150–400)
RBC: 5.43 MIL/uL (ref 4.22–5.81)
RDW: 14 % (ref 11.5–15.5)
WBC Count: 7.7 10*3/uL (ref 4.0–10.5)
nRBC: 0 % (ref 0.0–0.2)

## 2020-03-26 LAB — D-DIMER, QUANTITATIVE: D-Dimer, Quant: 2.76 ug/mL-FEU — ABNORMAL HIGH (ref 0.00–0.50)

## 2020-03-26 NOTE — Progress Notes (Signed)
Hematology and Oncology Follow Up Visit  Leslie Harrington [id number] [birth year] 64 y.o. 03/26/2020   Principle Diagnosis:  History of left lower extremity DVT and mildly elevated d-dimer Heterozygous Factor V Leiden and questionable elevated anti-cardiolipin  Current Therapy: Aspirin 81 mg PO daily    Interim History:  Leslie Harrington is here today for follow-up. 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 He states that they were able to get it all and has has healed nicely. He is taking his baby aspirin daily as prescribed.  He bruises easily but has had no issue with bleeding. No bruising or petechiae.  No fever, chills, n/v, cough, rash, dizziness, SOB, chest pain, palpitations, abdominal pain or changes in bowel or bladder habits.  The swelling in his left leg is much better since he started wearing a compression stocking daily.  No tenderness, numbness or tingling in his extremities.  No falls or syncope.  He has maintained a good appetite and is staying well hydrated. His weight is stable.   ECOG Performance Status: 1 - Symptomatic but completely ambulatory  Medications:  Allergies as of 03/26/2020   No Known Allergies     Medication List       Accurate as of Mar 26, 2020  2:11 PM. If you have any questions, ask your nurse or doctor.        aspirin EC 81 MG tablet Take 81 mg by mouth daily.   PARoxetine 10 MG tablet Commonly known as: PAXIL TAKE 1 TABLET BY MOUTH EVERY DAY   sildenafil 20 MG tablet Commonly known as: REVATIO May take 1-5 pills 45 minutes prior daily as needed.   tamsulosin 0.4 MG Caps capsule Commonly known as: FLOMAX Take 1 capsule (0.4 mg total) by mouth daily.       Allergies: No Known Allergies  Past Medical History, Surgical history, Social history, and Family History were reviewed and updated.  Review of Systems: All other 10 point review of systems  is negative.   Physical Exam:  vitals were not taken for this visit.   Wt Readings from Last 3 Encounters:  01/10/20 196 lb (88.9 kg)  01/03/20 195 lb (88.5 kg)  01/03/20 191 lb (86.6 kg)    Ocular: Sclerae unicteric, pupils equal, round and reactive to light Ear-nose-throat: Oropharynx clear, dentition fair Lymphatic: No cervical or supraclavicular adenopathy Lungs no rales or rhonchi, good excursion bilaterally Heart regular rate and rhythm, no murmur appreciated Abd soft, nontender, positive bowel sounds, no liver or spleen tip palpated on exam, no fluid wave  MSK no focal spinal tenderness, no joint edema Neuro: non-focal, well-oriented, appropriate affect Breasts: Deferred   Lab Results  Component Value Date   WBC 7.1 12/20/2019   HGB 16.2 12/20/2019   HCT 46.9 12/20/2019   MCV 84.1 12/20/2019   PLT 262 12/20/2019   Lab Results  Component Value Date   FERRITIN 279 12/20/2019   IRON 127 12/20/2019   TIBC 249 12/20/2019   UIBC 122 12/20/2019   IRONPCTSAT 51 12/20/2019   Lab Results  Component Value Date   RBC 5.58 12/20/2019   No results found for: KPAFRELGTCHN, LAMBDASER, KAPLAMBRATIO No results found for: IGGSERUM, IGA, IGMSERUM No results found for: Ronnald Ramp, A1GS, A2GS, Violet Baldy, MSPIKE, SPEI   Chemistry      Component Value Date/Time   NA 135 12/20/2019 0837   K 4.0 12/20/2019  0837   CL 102 12/20/2019 0837   CO2 25 12/20/2019 0837   BUN 14 12/20/2019 0837   CREATININE 1.04 12/20/2019 0837      Component Value Date/Time   CALCIUM 9.7 12/20/2019 0837   ALKPHOS 71 12/20/2019 0837   AST 11 (L) 12/20/2019 0837   ALT 10 12/20/2019 0837   BILITOT 0.7 12/20/2019 0837       Impression and Plan: Mr. Leslie Harrington is a very pleasant 64 yo caucasian gentleman with history of one episodes of left lower extremity DVT diagnosed in 2018 and treated with 6 months of Xarelto. So far, he has had no recurrent thrombus.  He was heterozygous for  Factor V Leiden and had a questionable elevated anti-cardiolipin. Homocystine level was also mildly elevated.  We will see him back in another 3 months.  He will contact our office with any questions or concerns. We can certainly see her sooner if needed.   Leslie Peace, NP 5/3/20212:11 PM

## 2020-03-27 ENCOUNTER — Telehealth: Payer: Self-pay | Admitting: Family

## 2020-03-27 LAB — HOMOCYSTEINE: Homocysteine: 20.6 umol/L — ABNORMAL HIGH (ref 0.0–17.2)

## 2020-03-27 NOTE — Telephone Encounter (Signed)
LM for appointments scheduled calendar mailed per 5/3 los

## 2020-03-29 ENCOUNTER — Encounter: Payer: Self-pay | Admitting: Family

## 2020-04-30 ENCOUNTER — Other Ambulatory Visit: Payer: Self-pay | Admitting: Family Medicine

## 2020-04-30 DIAGNOSIS — F341 Dysthymic disorder: Secondary | ICD-10-CM

## 2020-06-01 ENCOUNTER — Other Ambulatory Visit: Payer: Self-pay | Admitting: Family Medicine

## 2020-06-01 DIAGNOSIS — F341 Dysthymic disorder: Secondary | ICD-10-CM

## 2020-06-26 ENCOUNTER — Telehealth: Payer: Self-pay | Admitting: Hematology & Oncology

## 2020-06-26 ENCOUNTER — Inpatient Hospital Stay: Payer: Managed Care, Other (non HMO) | Attending: Family

## 2020-06-26 ENCOUNTER — Encounter: Payer: Self-pay | Admitting: Family

## 2020-06-26 ENCOUNTER — Other Ambulatory Visit: Payer: Self-pay

## 2020-06-26 ENCOUNTER — Inpatient Hospital Stay (HOSPITAL_BASED_OUTPATIENT_CLINIC_OR_DEPARTMENT_OTHER): Payer: Managed Care, Other (non HMO) | Admitting: Family

## 2020-06-26 VITALS — BP 181/92 | HR 59 | Temp 98.0°F | Resp 18 | Ht 73.0 in | Wt 203.1 lb

## 2020-06-26 DIAGNOSIS — E7211 Homocystinuria: Secondary | ICD-10-CM

## 2020-06-26 DIAGNOSIS — D6851 Activated protein C resistance: Secondary | ICD-10-CM

## 2020-06-26 DIAGNOSIS — I82402 Acute embolism and thrombosis of unspecified deep veins of left lower extremity: Secondary | ICD-10-CM

## 2020-06-26 DIAGNOSIS — Z7982 Long term (current) use of aspirin: Secondary | ICD-10-CM | POA: Diagnosis not present

## 2020-06-26 DIAGNOSIS — R7989 Other specified abnormal findings of blood chemistry: Secondary | ICD-10-CM | POA: Diagnosis not present

## 2020-06-26 DIAGNOSIS — Z86718 Personal history of other venous thrombosis and embolism: Secondary | ICD-10-CM | POA: Insufficient documentation

## 2020-06-26 LAB — CBC WITH DIFFERENTIAL (CANCER CENTER ONLY)
Abs Immature Granulocytes: 0.02 10*3/uL (ref 0.00–0.07)
Basophils Absolute: 0.1 10*3/uL (ref 0.0–0.1)
Basophils Relative: 1 %
Eosinophils Absolute: 0.3 10*3/uL (ref 0.0–0.5)
Eosinophils Relative: 4 %
HCT: 45 % (ref 39.0–52.0)
Hemoglobin: 15.3 g/dL (ref 13.0–17.0)
Immature Granulocytes: 0 %
Lymphocytes Relative: 26 %
Lymphs Abs: 1.9 10*3/uL (ref 0.7–4.0)
MCH: 29.1 pg (ref 26.0–34.0)
MCHC: 34 g/dL (ref 30.0–36.0)
MCV: 85.7 fL (ref 80.0–100.0)
Monocytes Absolute: 0.5 10*3/uL (ref 0.1–1.0)
Monocytes Relative: 6 %
Neutro Abs: 4.7 10*3/uL (ref 1.7–7.7)
Neutrophils Relative %: 63 %
Platelet Count: 228 10*3/uL (ref 150–400)
RBC: 5.25 MIL/uL (ref 4.22–5.81)
RDW: 14.3 % (ref 11.5–15.5)
WBC Count: 7.4 10*3/uL (ref 4.0–10.5)
nRBC: 0 % (ref 0.0–0.2)

## 2020-06-26 LAB — CMP (CANCER CENTER ONLY)
ALT: 10 U/L (ref 0–44)
AST: 11 U/L — ABNORMAL LOW (ref 15–41)
Albumin: 4.3 g/dL (ref 3.5–5.0)
Alkaline Phosphatase: 69 U/L (ref 38–126)
Anion gap: 6 (ref 5–15)
BUN: 18 mg/dL (ref 8–23)
CO2: 28 mmol/L (ref 22–32)
Calcium: 9.8 mg/dL (ref 8.9–10.3)
Chloride: 104 mmol/L (ref 98–111)
Creatinine: 1.13 mg/dL (ref 0.61–1.24)
GFR, Est AFR Am: >60 mL/min (ref >60)
GFR, Estimated: >60 mL/min (ref >60)
Glucose, Bld: 91 mg/dL (ref 70–99)
Potassium: 4.2 mmol/L (ref 3.5–5.1)
Sodium: 138 mmol/L (ref 135–145)
Total Bilirubin: 0.4 mg/dL (ref 0.3–1.2)
Total Protein: 6.7 g/dL (ref 6.5–8.1)

## 2020-06-26 LAB — D-DIMER, QUANTITATIVE: D-Dimer, Quant: 1.5 ug/mL-FEU — ABNORMAL HIGH (ref 0.00–0.50)

## 2020-06-26 NOTE — Progress Notes (Signed)
Hematology and Oncology Follow Up Visit  Leslie Harrington [id number] [birth year] 64 y.o. 06/26/2020   Principle Diagnosis:  History of left lower extremity DVT and mildly elevated d-dimer Heterozygous Factor V Leiden and questionable elevated anti-cardiolipin  Current Therapy: Aspirin 81 mg PO daily    Interim History:  Leslie Harrington is here today for follow-up. He is doing well on a baby aspirin daily and has no complaints at this time.  I retook his BP manually and it was down to 164/80.  D-dimer back in May was 2.76. Today's result along with homocystine and cardiolipin antibodies is pending.  No fever, chills, n/v, cough, rash, dizziness, headache, vision changes, SOB, chest pain, palpitations, abdominal pain or changes in bowel or bladder habits.  He has puffiness in the left ankle that he states has improved since he wears his compression stockings regularly. Pedal pulses are 2+.  No tenderness, numbness or tingling in his extremities at this time.  No falls or syncope.  He is eating well and staying properly hydrated. His weight is stable.   ECOG Performance Status: 1 - Symptomatic but completely ambulatory  Medications:  Allergies as of 06/26/2020   No Known Allergies     Medication List       Accurate as of June 26, 2020  2:37 PM. If you have any questions, ask your nurse or doctor.        STOP taking these medications   doxycycline 100 MG capsule Commonly known as: VIBRAMYCIN Stopped by: Laverna Peace, NP     TAKE these medications   aspirin EC 81 MG tablet Take 81 mg by mouth daily.   PARoxetine 10 MG tablet Commonly known as: PAXIL TAKE 1 TABLET BY MOUTH EVERY DAY   sildenafil 20 MG tablet Commonly known as: REVATIO May take 1-5 pills 45 minutes prior daily as needed.   tamsulosin 0.4 MG Caps capsule Commonly known as: FLOMAX Take 1 capsule (0.4 mg total) by mouth daily.       Allergies: No Known Allergies  Past Medical History, Surgical  history, Social history, and Family History were reviewed and updated.  Review of Systems: All other 10 point review of systems is negative.   Physical Exam:  height is 6\' 1"  (1.854 m) and weight is 203 lb 1.9 oz (92.1 kg). His oral temperature is 98 F (36.7 C). His blood pressure is 181/92 (abnormal) and his pulse is 59 (abnormal). His respiration is 18 and oxygen saturation is 100%.   Wt Readings from Last 3 Encounters:  06/26/20 203 lb 1.9 oz (92.1 kg)  03/26/20 196 lb (88.9 kg)  01/10/20 196 lb (88.9 kg)    Ocular: Sclerae unicteric, pupils equal, round and reactive to light Ear-nose-throat: Oropharynx clear, dentition fair Lymphatic: No cervical or supraclavicular adenopathy Lungs no rales or rhonchi, good excursion bilaterally Heart regular rate and rhythm, no murmur appreciated Abd soft, nontender, positive bowel sounds, no liver or spleen tip palpated on exam, no fluid wave  MSK no focal spinal tenderness, no joint edema Neuro: non-focal, well-oriented, appropriate affect Breasts: Deferred   Lab Results  Component Value Date   WBC 7.4 06/26/2020   HGB 15.3 06/26/2020   HCT 45.0 06/26/2020   MCV 85.7 06/26/2020   PLT 228 06/26/2020   Lab Results  Component Value Date   FERRITIN 279 12/20/2019   IRON 127 12/20/2019   TIBC 249 12/20/2019   UIBC 122 12/20/2019   IRONPCTSAT 51 12/20/2019   Lab Results  Component  Value Date   RBC 5.25 06/26/2020   No results found for: KPAFRELGTCHN, LAMBDASER, KAPLAMBRATIO No results found for: IGGSERUM, IGA, IGMSERUM No results found for: Odetta Pink, SPEI   Chemistry      Component Value Date/Time   NA 138 06/26/2020 1334   K 4.2 06/26/2020 1334   CL 104 06/26/2020 1334   CO2 28 06/26/2020 1334   BUN 18 06/26/2020 1334   CREATININE 1.13 06/26/2020 1334      Component Value Date/Time   CALCIUM 9.8 06/26/2020 1334   ALKPHOS 69 06/26/2020 1334   AST 11 (L) 06/26/2020  1334   ALT 10 06/26/2020 1334   BILITOT 0.4 06/26/2020 1334       Impression and Plan: Leslie Harrington is a very pleasant 64 yo caucasian gentleman with history of one episodes of left lower extremity DVT diagnosed in 2018 and treated with 6 months of Xarelto. He was heterozygous for Factor V Leiden and had a questionable elevated anti-cardiolipin. Homocystine level was also mildly elevated.  So far he continues to do well on daily aspirin and there has been no evidence of recurrence.  We will plan to see him again in another 4 months.  He can contact our office with any questions or concerns.   Laverna Peace, NP 8/3/20212:37 PM

## 2020-06-26 NOTE — Telephone Encounter (Signed)
Appointments scheduled calendar mailed per 8/3 los

## 2020-06-27 LAB — HOMOCYSTEINE: Homocysteine: 20.3 umol/L — ABNORMAL HIGH (ref 0.0–17.2)

## 2020-06-28 LAB — CARDIOLIPIN ANTIBODIES, IGG, IGM, IGA
Anticardiolipin IgA: <9 APL U/mL (ref 0–11)
Anticardiolipin IgG: 10 GPL U/mL (ref 0–14)
Anticardiolipin IgM: 17 MPL U/mL — ABNORMAL HIGH (ref 0–12)

## 2020-07-17 ENCOUNTER — Other Ambulatory Visit: Payer: Self-pay | Admitting: Family Medicine

## 2020-07-17 DIAGNOSIS — N401 Enlarged prostate with lower urinary tract symptoms: Secondary | ICD-10-CM

## 2020-09-15 ENCOUNTER — Other Ambulatory Visit: Payer: Self-pay | Admitting: Family Medicine

## 2020-09-15 DIAGNOSIS — R3911 Hesitancy of micturition: Secondary | ICD-10-CM

## 2020-09-15 DIAGNOSIS — N401 Enlarged prostate with lower urinary tract symptoms: Secondary | ICD-10-CM

## 2020-10-25 ENCOUNTER — Telehealth: Payer: Self-pay | Admitting: Family

## 2020-10-25 NOTE — Telephone Encounter (Signed)
I returned call to patient in order to reschedule his 12/3 appt per his request.  He was ok with new date/time of 12/20 @ 8:15/ per 12/1 Voicemail

## 2020-10-26 ENCOUNTER — Inpatient Hospital Stay: Payer: Managed Care, Other (non HMO)

## 2020-10-26 ENCOUNTER — Inpatient Hospital Stay: Payer: Managed Care, Other (non HMO) | Admitting: Family

## 2020-11-09 ENCOUNTER — Other Ambulatory Visit: Payer: Self-pay

## 2020-11-09 DIAGNOSIS — R3911 Hesitancy of micturition: Secondary | ICD-10-CM

## 2020-11-09 DIAGNOSIS — N401 Enlarged prostate with lower urinary tract symptoms: Secondary | ICD-10-CM

## 2020-11-09 MED ORDER — TAMSULOSIN HCL 0.4 MG PO CAPS: ORAL_CAPSULE | ORAL | 2 refills | Status: DC

## 2020-11-09 NOTE — Telephone Encounter (Signed)
Pt called and stated that the pharmacy told him that they did not have his medication on file.  I informed pt that we will resend medication.   Last OV 01/03/20 Last fill 09/17/20  #30/2

## 2020-11-12 ENCOUNTER — Inpatient Hospital Stay: Payer: Managed Care, Other (non HMO) | Attending: Family

## 2020-11-12 ENCOUNTER — Inpatient Hospital Stay (HOSPITAL_BASED_OUTPATIENT_CLINIC_OR_DEPARTMENT_OTHER): Payer: Managed Care, Other (non HMO) | Admitting: Family

## 2020-11-12 ENCOUNTER — Encounter: Payer: Self-pay | Admitting: Family

## 2020-11-12 ENCOUNTER — Telehealth: Payer: Self-pay | Admitting: Family

## 2020-11-12 ENCOUNTER — Other Ambulatory Visit: Payer: Self-pay

## 2020-11-12 VITALS — BP 165/81 | HR 75 | Temp 98.1°F | Resp 18 | Ht 73.0 in | Wt 205.0 lb

## 2020-11-12 DIAGNOSIS — Z86718 Personal history of other venous thrombosis and embolism: Secondary | ICD-10-CM | POA: Diagnosis not present

## 2020-11-12 DIAGNOSIS — R7989 Other specified abnormal findings of blood chemistry: Secondary | ICD-10-CM

## 2020-11-12 DIAGNOSIS — Z7982 Long term (current) use of aspirin: Secondary | ICD-10-CM | POA: Diagnosis not present

## 2020-11-12 DIAGNOSIS — E7211 Homocystinuria: Secondary | ICD-10-CM

## 2020-11-12 DIAGNOSIS — I82402 Acute embolism and thrombosis of unspecified deep veins of left lower extremity: Secondary | ICD-10-CM

## 2020-11-12 DIAGNOSIS — D6851 Activated protein C resistance: Secondary | ICD-10-CM

## 2020-11-12 LAB — CBC WITH DIFFERENTIAL (CANCER CENTER ONLY)
Abs Immature Granulocytes: 0.02 10*3/uL (ref 0.00–0.07)
Basophils Absolute: 0.1 10*3/uL (ref 0.0–0.1)
Basophils Relative: 1 %
Eosinophils Absolute: 0.2 10*3/uL (ref 0.0–0.5)
Eosinophils Relative: 3 %
HCT: 47.7 % (ref 39.0–52.0)
Hemoglobin: 16 g/dL (ref 13.0–17.0)
Immature Granulocytes: 0 %
Lymphocytes Relative: 20 %
Lymphs Abs: 1.5 10*3/uL (ref 0.7–4.0)
MCH: 29.4 pg (ref 26.0–34.0)
MCHC: 33.5 g/dL (ref 30.0–36.0)
MCV: 87.5 fL (ref 80.0–100.0)
Monocytes Absolute: 0.6 10*3/uL (ref 0.1–1.0)
Monocytes Relative: 7 %
Neutro Abs: 5.2 10*3/uL (ref 1.7–7.7)
Neutrophils Relative %: 69 %
Platelet Count: 235 10*3/uL (ref 150–400)
RBC: 5.45 MIL/uL (ref 4.22–5.81)
RDW: 14.6 % (ref 11.5–15.5)
WBC Count: 7.6 10*3/uL (ref 4.0–10.5)
nRBC: 0 % (ref 0.0–0.2)

## 2020-11-12 LAB — CMP (CANCER CENTER ONLY)
ALT: 11 U/L (ref 0–44)
AST: 11 U/L — ABNORMAL LOW (ref 15–41)
Albumin: 3.8 g/dL (ref 3.5–5.0)
Alkaline Phosphatase: 67 U/L (ref 38–126)
Anion gap: 7 (ref 5–15)
BUN: 19 mg/dL (ref 8–23)
CO2: 29 mmol/L (ref 22–32)
Calcium: 9.8 mg/dL (ref 8.9–10.3)
Chloride: 106 mmol/L (ref 98–111)
Creatinine: 1.12 mg/dL (ref 0.61–1.24)
GFR, Estimated: >60 mL/min (ref >60)
Glucose, Bld: 95 mg/dL (ref 70–99)
Potassium: 3.8 mmol/L (ref 3.5–5.1)
Sodium: 142 mmol/L (ref 135–145)
Total Bilirubin: 0.3 mg/dL (ref 0.3–1.2)
Total Protein: 6.6 g/dL (ref 6.5–8.1)

## 2020-11-12 LAB — D-DIMER, QUANTITATIVE: D-Dimer, Quant: 1.2 ug/mL-FEU — ABNORMAL HIGH (ref 0.00–0.50)

## 2020-11-12 NOTE — Progress Notes (Signed)
Hematology and Oncology Follow Up Visit  Leslie Harrington [id number] 04-11-1956 64 y.o. 11/12/2020   Principle Diagnosis:  History of left lower extremity DVT and mildly elevated d-dimer Heterozygous Factor V Leiden and questionable elevated anti-cardiolipin  Current Therapy: Aspirin 81 mg PO daily   Interim History:  Leslie Harrington is here today for follow-up. He is doing well and has no complaints at this time.  He has been wearing his compression stocking on the left leg daily and doing his best to take the baby aspirin as directed.  He denies swelling, tenderness, numbness or tingling in his extremities.  No fever, chills, n/v, cough, rash, dizziness, SOB, chest pain, palpitations, abdominal pain or changes in bowel or bladder habits.  No episodes of bleeding noted. He bruises easily on aspirin but not in excess. No petechiae.  His D-dimer today is stable at 1.20.  He has maintained a good appetite and is staying well hydrated. His weight is stable.  He is concerned about his BP at 165/81. He is asymptomatic. No headaches, blurred or loss of vision.  I suggested he get a cuff, check BID (AM and PM) and keep a record for a couple weeks and contact his PCP for follow-up to discuss.   ECOG Performance Status: 1 - Symptomatic but completely ambulatory  Medications:  Allergies as of 11/12/2020   No Known Allergies     Medication List       Accurate as of November 12, 2020  9:00 AM. If you have any questions, ask your nurse or doctor.        aspirin EC 81 MG tablet Take 81 mg by mouth daily.   PARoxetine 10 MG tablet Commonly known as: PAXIL TAKE 1 TABLET BY MOUTH EVERY DAY   sildenafil 20 MG tablet Commonly known as: REVATIO May take 1-5 pills 45 minutes prior daily as needed.   tamsulosin 0.4 MG Caps capsule Commonly known as: FLOMAX TAKE 1 CAPSULE(0.4 MG) BY MOUTH DAILY       Allergies: No Known Allergies  Past Medical History, Surgical history, Social  history, and Family History were reviewed and updated.  Review of Systems: All other 10 point review of systems is negative.   Physical Exam:  height is 6\' 1"  (1.854 m) and weight is 205 lb (93 kg). His oral temperature is 98.1 F (36.7 C). His blood pressure is 165/81 (abnormal) and his pulse is 75. His respiration is 18 and oxygen saturation is 100%.   Wt Readings from Last 3 Encounters:  11/12/20 205 lb (93 kg)  06/26/20 203 lb 1.9 oz (92.1 kg)  03/26/20 196 lb (88.9 kg)    Ocular: Sclerae unicteric, pupils equal, round and reactive to light Ear-nose-throat: Oropharynx clear, dentition fair Lymphatic: No cervical or supraclavicular adenopathy Lungs no rales or rhonchi, good excursion bilaterally Heart regular rate and rhythm, no murmur appreciated Abd soft, nontender, positive bowel sounds MSK no focal spinal tenderness, no joint edema Neuro: non-focal, well-oriented, appropriate affect Breasts: Deferred   Lab Results  Component Value Date   WBC 7.6 11/12/2020   HGB 16.0 11/12/2020   HCT 47.7 11/12/2020   MCV 87.5 11/12/2020   PLT 235 11/12/2020   Lab Results  Component Value Date   FERRITIN 279 12/20/2019   IRON 127 12/20/2019   TIBC 249 12/20/2019   UIBC 122 12/20/2019   IRONPCTSAT 51 12/20/2019   Lab Results  Component Value Date   RBC 5.45 11/12/2020   No results found for: KPAFRELGTCHN,  LAMBDASER, KAPLAMBRATIO No results found for: IGGSERUM, IGA, IGMSERUM No results found for: Odetta Pink, SPEI   Chemistry      Component Value Date/Time   NA 138 06/26/2020 1334   K 4.2 06/26/2020 1334   CL 104 06/26/2020 1334   CO2 28 06/26/2020 1334   BUN 18 06/26/2020 1334   CREATININE 1.13 06/26/2020 1334      Component Value Date/Time   CALCIUM 9.8 06/26/2020 1334   ALKPHOS 69 06/26/2020 1334   AST 11 (L) 06/26/2020 1334   ALT 10 06/26/2020 1334   BILITOT 0.4 06/26/2020 1334       Impression and  Plan: Leslie Harrington is a very pleasant 64 yo caucasian gentleman with history of one episode of left lower extremity DVT diagnosed in 2018 and treated with 6 months of Xarelto.He was heterozygousforFactor V Leiden andhad aquestionable elevated anti-cardiolipin. Homocystine level was also mildly elevated.  He continues to do well and has no complaints at this time. Thankfully he has had no new issues with recurrence.  He will continue has baby aspirin daily.  Follow-up in 6 months.  He was encouraged to contact our office with any questions or concerns.   Laverna Peace, NP 12/20/20219:00 AM

## 2020-11-12 NOTE — Telephone Encounter (Signed)
Called and spoke with patient regarding appointments added per 12/20 los

## 2020-11-13 LAB — HOMOCYSTEINE: Homocysteine: 15.6 umol/L (ref 0.0–17.2)

## 2020-11-14 LAB — CARDIOLIPIN ANTIBODIES, IGG, IGM, IGA
Anticardiolipin IgA: <9 APL U/mL (ref 0–11)
Anticardiolipin IgG: <9 GPL U/mL (ref 0–14)
Anticardiolipin IgM: 62 MPL U/mL — ABNORMAL HIGH (ref 0–12)

## 2020-11-22 ENCOUNTER — Other Ambulatory Visit: Payer: Managed Care, Other (non HMO)

## 2020-12-08 ENCOUNTER — Other Ambulatory Visit: Payer: Self-pay | Admitting: Family Medicine

## 2020-12-08 DIAGNOSIS — F341 Dysthymic disorder: Secondary | ICD-10-CM

## 2020-12-18 ENCOUNTER — Telehealth: Payer: Self-pay

## 2020-12-18 NOTE — Telephone Encounter (Signed)
Pt calling to request medication Paxil 10mg  to be sent to new pharmacy, [facility], [facility]. Last fill 12/10/20  #90/1 Please advise.

## 2020-12-18 NOTE — Telephone Encounter (Signed)
Spoke with pharmacist at [facility] who states that she will call [facility] to have Rx transferred over. Patient aware

## 2021-03-08 ENCOUNTER — Other Ambulatory Visit: Payer: Self-pay | Admitting: Family Medicine

## 2021-03-08 DIAGNOSIS — N401 Enlarged prostate with lower urinary tract symptoms: Secondary | ICD-10-CM

## 2021-05-13 ENCOUNTER — Ambulatory Visit: Payer: Managed Care, Other (non HMO) | Admitting: Family

## 2021-05-13 ENCOUNTER — Other Ambulatory Visit: Payer: Managed Care, Other (non HMO)

## 2021-05-21 ENCOUNTER — Other Ambulatory Visit: Payer: Self-pay | Admitting: Family Medicine

## 2021-05-21 DIAGNOSIS — N401 Enlarged prostate with lower urinary tract symptoms: Secondary | ICD-10-CM

## 2021-05-21 DIAGNOSIS — R3911 Hesitancy of micturition: Secondary | ICD-10-CM

## 2021-07-19 IMAGING — CT CT ANGIO CHEST
3 of 9 series · 17 of 36 positions shown · IV contrast (Omnipaque)
Comparison: Chest radiograph April 26, 2013
COMPARISON: Chest radiograph April 26, 2013

Addendum:
CLINICAL DATA: Chest pain with cardiac palpitations

EXAM:
CT ANGIOGRAPHY CHEST WITH CONTRAST
TECHNIQUE: Multidetector CT imaging of the chest was performed using the
standard protocol during bolus administration of intravenous
contrast. Multiplanar CT image reconstructions and MIPs were
obtained to evaluate the vascular anatomy.
CONTRAST:  100mL OMNIPAQUE IOHEXOL 350 MG/ML SOLN

[Series 5: pe lung · axial · 0.97mm/px · 1 of 97 slices shown]
[im 33/97  mediastinal]
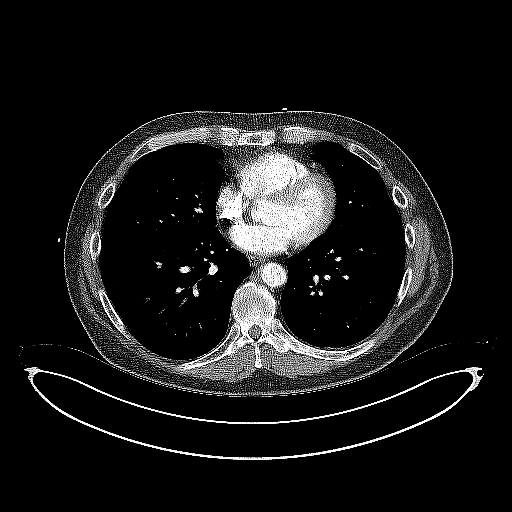

[Series 6: pe thins · axial · 0.97mm/px · z∈[+1116,+1392]mm · 15 of 316 slices shown]
[im 20/316  lung]
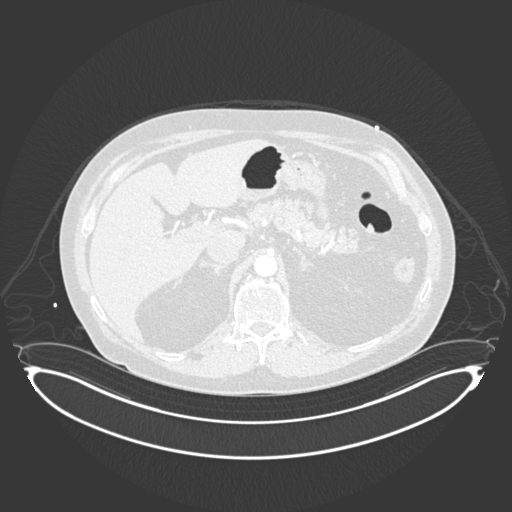
[im 40/316  mediastinal]
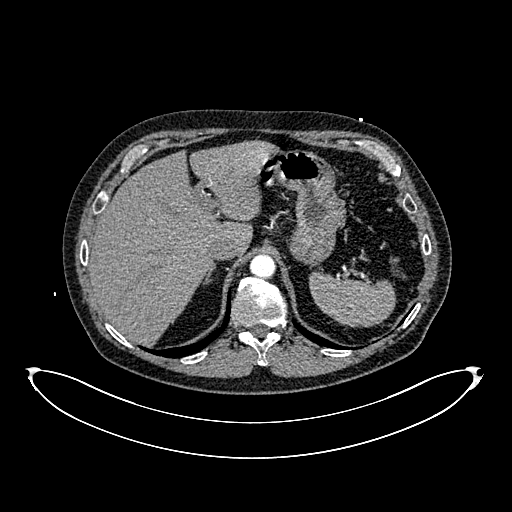
[im 60/316  lung]
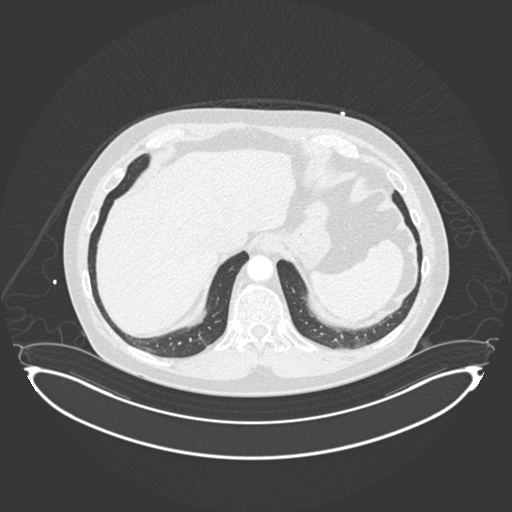
[im 79/316  mediastinal]
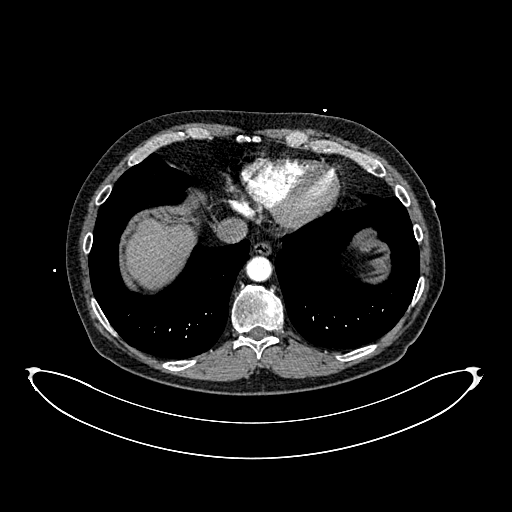
[im 99/316  lung]
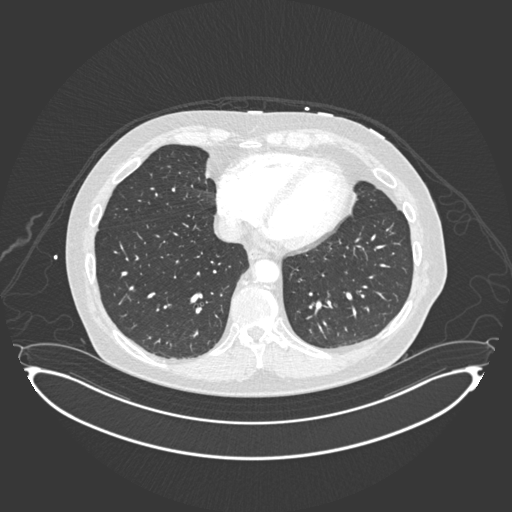
[im 119/316  mediastinal]
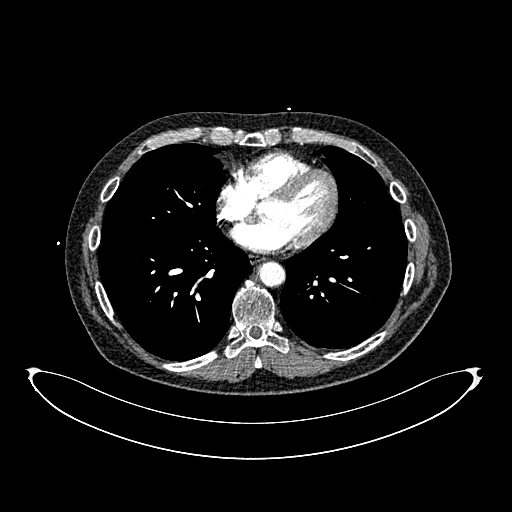
[im 138/316  lung]
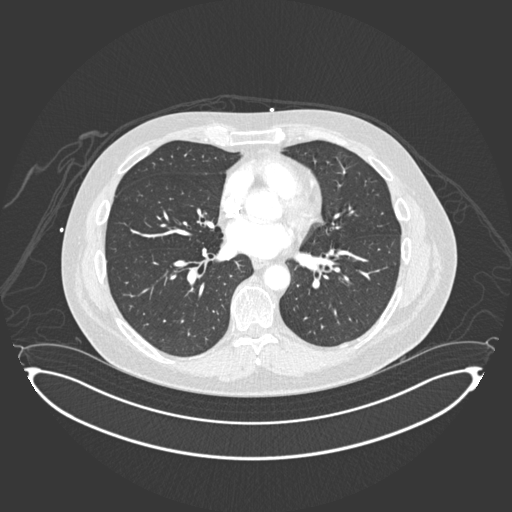
[im 158/316  mediastinal]
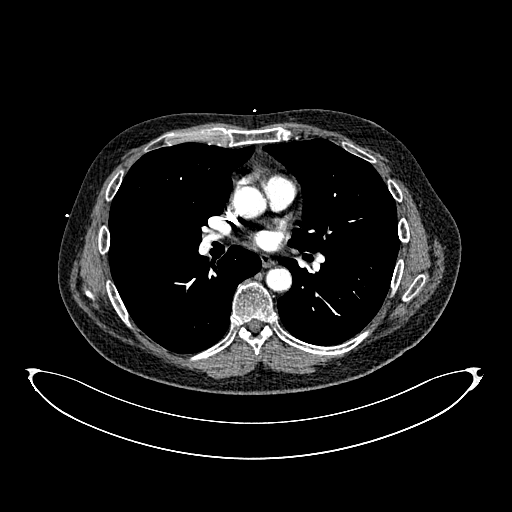
[im 178/316  lung]
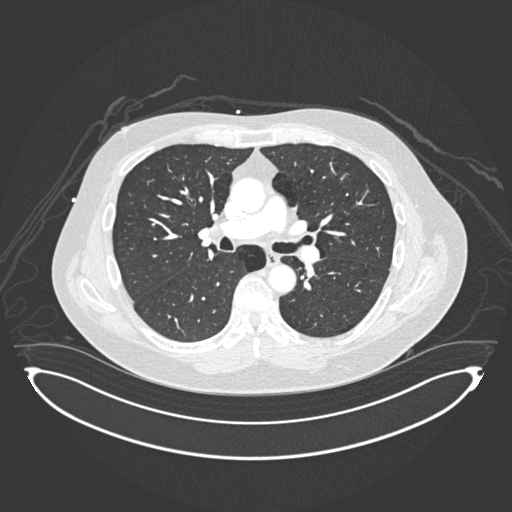
[im 197/316  mediastinal]
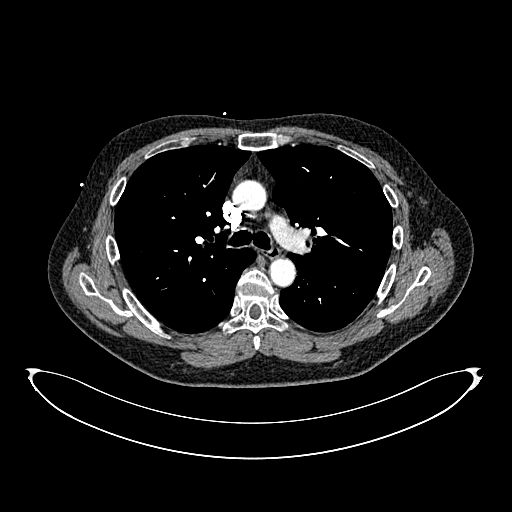
[im 217/316  lung]
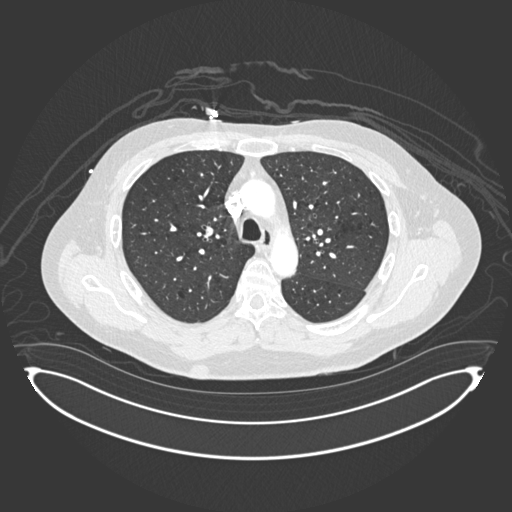
[im 237/316  mediastinal]
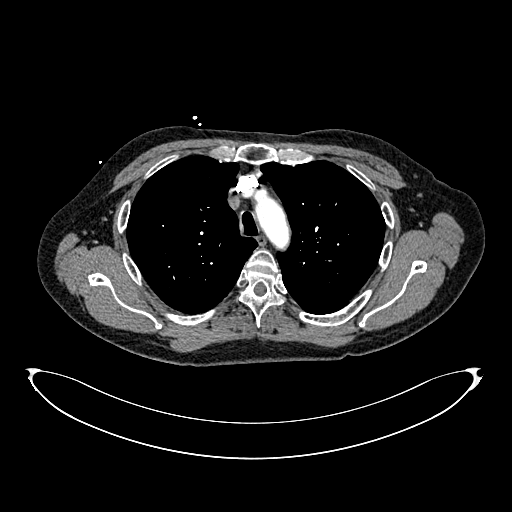
[im 256/316  lung]
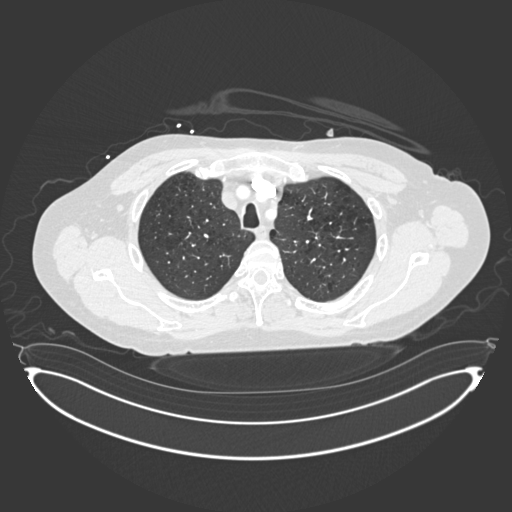
[im 276/316  mediastinal]
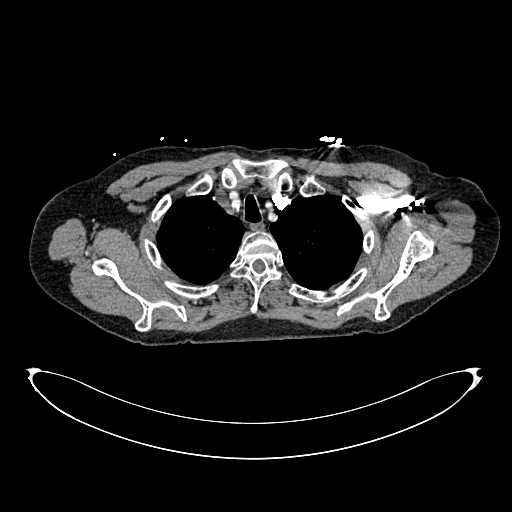
[im 296/316  lung]
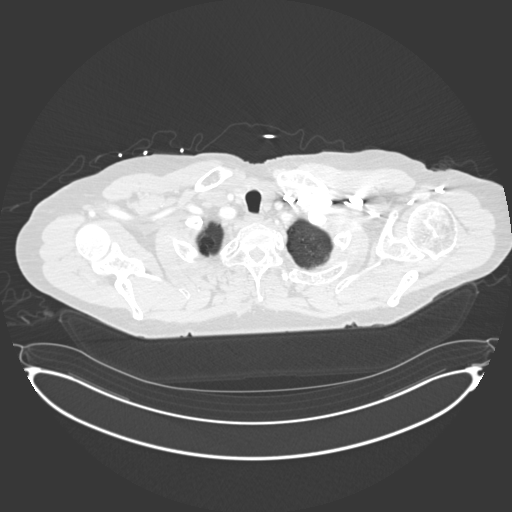

[Series 7: pe coronal mpr · coronal · 0.66mm/px · 1 of 144 slices shown]
[im 72/144  mediastinal]
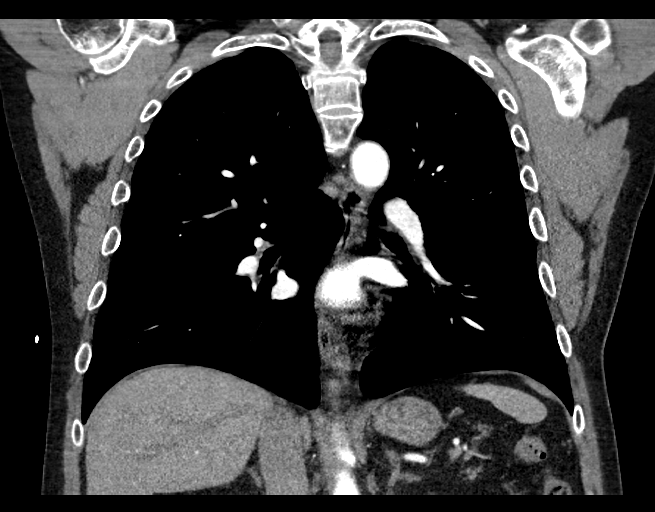

[17 of 36 positions shown; findings below may reference images not displayed]

FINDINGS: Cardiovascular: There is no demonstrable pulmonary embolus. There is
no thoracic aortic aneurysm or dissection. Visualized great vessels
appear unremarkable. There is no pericardial effusion or pericardial
thickening.

Mediastinum/Nodes: Visualized thyroid appears normal. There is no
appreciable thoracic adenopathy. No esophageal lesions are evident.

Lungs/Pleura: Small bullae are noted near the apices medially on
each side. There is slight bibasilar atelectasis. No edema or
airspace opacity. No pleural effusions on either side.

Upper Abdomen: There is a probable cyst near the dome of the liver
on the right anteriorly measuring 1.3 x 1.0 cm. Visualized upper
abdominal structures otherwise appear unremarkable.

Musculoskeletal: No blastic or lytic bone lesions evident. There is
slight anterior wedging at T6. No chest wall lesions evident.

Review of the MIP images confirms the above findings.
IMPRESSION: 1. No demonstrable pulmonary embolus. No thoracic aortic aneurysm or
dissection.

2. Areas of slight atelectatic change. Lungs edema or airspace
opacity. No pleural effusions.

3.  No evident adenopathy.

ADDENDUM:
There is an apparent posterior right mid chest sebaceous cyst
measuring 2.4 x 1.6 cm.

*** End of Addendum ***
FINDINGS: Cardiovascular: There is no demonstrable pulmonary embolus. There is
no thoracic aortic aneurysm or dissection. Visualized great vessels
appear unremarkable. There is no pericardial effusion or pericardial
thickening.

Mediastinum/Nodes: Visualized thyroid appears normal. There is no
appreciable thoracic adenopathy. No esophageal lesions are evident.

Lungs/Pleura: Small bullae are noted near the apices medially on
each side. There is slight bibasilar atelectasis. No edema or
airspace opacity. No pleural effusions on either side.

Upper Abdomen: There is a probable cyst near the dome of the liver
on the right anteriorly measuring 1.3 x 1.0 cm. Visualized upper
abdominal structures otherwise appear unremarkable.

Musculoskeletal: No blastic or lytic bone lesions evident. There is
slight anterior wedging at T6. No chest wall lesions evident.

Review of the MIP images confirms the above findings.
IMPRESSION: 1. No demonstrable pulmonary embolus. No thoracic aortic aneurysm or
dissection.

2. Areas of slight atelectatic change. Lungs edema or airspace
opacity. No pleural effusions.

3.  No evident adenopathy.

## 2021-07-20 ENCOUNTER — Other Ambulatory Visit: Payer: Self-pay | Admitting: Family

## 2021-07-20 DIAGNOSIS — F341 Dysthymic disorder: Secondary | ICD-10-CM

## 2021-07-24 ENCOUNTER — Other Ambulatory Visit: Payer: Self-pay | Admitting: Family Medicine

## 2021-07-24 DIAGNOSIS — R3911 Hesitancy of micturition: Secondary | ICD-10-CM

## 2021-07-24 DIAGNOSIS — N401 Enlarged prostate with lower urinary tract symptoms: Secondary | ICD-10-CM

## 2021-09-22 ENCOUNTER — Other Ambulatory Visit: Payer: Self-pay | Admitting: Family Medicine

## 2021-09-22 DIAGNOSIS — R3911 Hesitancy of micturition: Secondary | ICD-10-CM

## 2021-09-22 DIAGNOSIS — N401 Enlarged prostate with lower urinary tract symptoms: Secondary | ICD-10-CM

## 2021-09-29 ENCOUNTER — Other Ambulatory Visit: Payer: Self-pay | Admitting: Family Medicine

## 2021-09-29 DIAGNOSIS — N401 Enlarged prostate with lower urinary tract symptoms: Secondary | ICD-10-CM

## 2022-03-18 ENCOUNTER — Ambulatory Visit (INDEPENDENT_AMBULATORY_CARE_PROVIDER_SITE_OTHER): Payer: Managed Care, Other (non HMO) | Admitting: Family Medicine

## 2022-03-18 ENCOUNTER — Encounter: Payer: Self-pay | Admitting: Family Medicine

## 2022-03-18 VITALS — BP 156/84 | HR 90 | Temp 97.7°F | Ht 73.0 in | Wt 213.8 lb

## 2022-03-18 DIAGNOSIS — F17210 Nicotine dependence, cigarettes, uncomplicated: Secondary | ICD-10-CM | POA: Diagnosis not present

## 2022-03-18 DIAGNOSIS — S39012A Strain of muscle, fascia and tendon of lower back, initial encounter: Secondary | ICD-10-CM

## 2022-03-18 DIAGNOSIS — E782 Mixed hyperlipidemia: Secondary | ICD-10-CM

## 2022-03-18 DIAGNOSIS — I10 Essential (primary) hypertension: Secondary | ICD-10-CM

## 2022-03-18 DIAGNOSIS — N401 Enlarged prostate with lower urinary tract symptoms: Secondary | ICD-10-CM

## 2022-03-18 DIAGNOSIS — R3911 Hesitancy of micturition: Secondary | ICD-10-CM

## 2022-03-18 MED ORDER — CYCLOBENZAPRINE HCL 10 MG PO TABS: 10.0000 mg | ORAL_TABLET | Freq: Every day | ORAL | 0 refills | Status: DC

## 2022-03-18 MED ORDER — MELOXICAM 15 MG PO TABS: 15.0000 mg | ORAL_TABLET | Freq: Every day | ORAL | 0 refills | Status: DC

## 2022-03-18 MED ORDER — TAMSULOSIN HCL 0.4 MG PO CAPS: 0.4000 mg | ORAL_CAPSULE | Freq: Every day | ORAL | 1 refills | Status: DC

## 2022-03-18 MED ORDER — TAMSULOSIN HCL 0.4 MG PO CAPS: 0.4000 mg | ORAL_CAPSULE | Freq: Every day | ORAL | 3 refills | Status: DC

## 2022-03-18 MED ORDER — IRBESARTAN 75 MG PO TABS: 75.0000 mg | ORAL_TABLET | Freq: Every day | ORAL | 1 refills | Status: DC

## 2022-03-18 NOTE — Progress Notes (Signed)
? ?Established Patient Office Visit ? ?Subjective   ?Patient ID: Leslie Harrington, male    DOB: November 18, 1956  Age: 66 y.o. MRN: [id number] ? ?Chief Complaint  ?Patient presents with  ? Medication Refill  ?  Refill on medications, concerns about lower mid back pains x 1 week Tylenol no longer helping.   ? ? ?Medication Refill ?Associated symptoms include myalgias. Pertinent negatives include no abdominal pain, rash or weakness.  follow-up for hypertension, BPH, mixed hyperlipidemia, tobacco use and lower back pain.  Lost to follow-up for few years now.  2-week history of lower back pain that started when he was pulling a pallet jack that got stuck.  He felt a pain in his left lower back with a warm sensation moving up the side of his spine all the way up to his neck.  Does not seem to be improving.  No change in bowel or bladder functions.  No numbness or tingling or weakness in his lower extremities.  Needs a refill on tamsulosin it has been helping his urine flow. ? ? ? ?Review of Systems  ?Constitutional: Negative.   ?HENT: Negative.    ?Eyes:  Negative for blurred vision, discharge and redness.  ?Respiratory: Negative.    ?Cardiovascular: Negative.   ?Gastrointestinal:  Negative for abdominal pain.  ?Genitourinary: Negative.   ?Musculoskeletal:  Positive for back pain and myalgias.  ?Skin:  Negative for rash.  ?Neurological:  Negative for tingling, loss of consciousness and weakness.  ?Endo/Heme/Allergies:  Negative for polydipsia.  ? ?  ?Objective:  ?  ? ?BP (!) 156/84 (BP Location: Right Arm, Patient Position: Sitting, Cuff Size: Large)   Pulse 90   Temp 97.7 ?F (36.5 ?C) (Temporal)   Ht '6\' 1"'$  (1.854 m)   Wt 213 lb 12.8 oz (97 kg)   SpO2 97%   BMI 28.21 kg/m?  ?BP Readings from Last 3 Encounters:  ?03/18/22 (!) 156/84  ?11/12/20 (!) 165/81  ?06/26/20 (!) 181/92  ? ?Wt Readings from Last 3 Encounters:  ?03/18/22 213 lb 12.8 oz (97 kg)  ?11/12/20 205 lb (93 kg)  ?06/26/20 203 lb 1.9 oz (92.1 kg)  ? ?   ? ?Physical Exam ?Constitutional:   ?   General: He is not in acute distress. ?   Appearance: Normal appearance. He is not ill-appearing, toxic-appearing or diaphoretic.  ?HENT:  ?   Head: Normocephalic and atraumatic.  ?   Right Ear: External ear normal.  ?   Left Ear: External ear normal.  ?   Mouth/Throat:  ?   Mouth: Mucous membranes are moist.  ?   Pharynx: Oropharynx is clear. No oropharyngeal exudate or posterior oropharyngeal erythema.  ?Eyes:  ?   General: No scleral icterus.    ?   Right eye: No discharge.     ?   Left eye: No discharge.  ?   Extraocular Movements: Extraocular movements intact.  ?   Conjunctiva/sclera: Conjunctivae normal.  ?   Pupils: Pupils are equal, round, and reactive to light.  ?Cardiovascular:  ?   Rate and Rhythm: Normal rate and regular rhythm.  ?Pulmonary:  ?   Effort: Pulmonary effort is normal. No respiratory distress.  ?   Breath sounds: Normal breath sounds.  ?Abdominal:  ?   General: Bowel sounds are normal.  ?   Tenderness: There is no abdominal tenderness. There is no guarding.  ?Musculoskeletal:  ?   Cervical back: No rigidity or tenderness.  ?   Lumbar back: No tenderness  or bony tenderness. Normal range of motion. Negative right straight leg raise test and negative left straight leg raise test.  ?Skin: ?   General: Skin is warm and dry.  ?Neurological:  ?   Mental Status: He is alert and oriented to person, place, and time.  ?   Motor: No weakness.  ?   Deep Tendon Reflexes:  ?   Reflex Scores: ?     Patellar reflexes are 2+ on the right side and 2+ on the left side. ?     Achilles reflexes are 1+ on the right side and 1+ on the left side. ?Psychiatric:     ?   Mood and Affect: Mood normal.     ?   Behavior: Behavior normal.  ? ? ? ?No results found for any visits on 03/18/22. ? ? ? ?The ASCVD Risk score (Arnett DK, et al., 2019) failed to calculate for the following reasons: ?  Cannot find a previous HDL lab ?  Cannot find a previous total cholesterol lab ? ?   ?Assessment & Plan:  ? ?Problem List Items Addressed This Visit   ? ?  ? Cardiovascular and Mediastinum  ? Essential hypertension - Primary  ? Relevant Medications  ? irbesartan (AVAPRO) 75 MG tablet  ? Other Relevant Orders  ? CBC  ? Comprehensive metabolic panel  ?  ? Musculoskeletal and Integument  ? Strain of lumbar region  ? Relevant Medications  ? cyclobenzaprine (FLEXERIL) 10 MG tablet  ? meloxicam (MOBIC) 15 MG tablet  ?  ? Genitourinary  ? Benign prostatic hyperplasia with urinary hesitancy  ? Relevant Medications  ? tamsulosin (FLOMAX) 0.4 MG CAPS capsule  ? Other Relevant Orders  ? PSA  ? Urinalysis, Routine w reflex microscopic  ?  ? Other  ? Mixed hyperlipidemia  ? Relevant Medications  ? irbesartan (AVAPRO) 75 MG tablet  ? Other Relevant Orders  ? LDL cholesterol, direct  ? Lipid panel  ? Cigarette smoker  ? ? ?Return in about 6 weeks (around 04/29/2022), or Return fasting for above ordered blood work and in 6 weeks..  ? ? ?Libby Maw, MD ? ?

## 2022-09-11 ENCOUNTER — Other Ambulatory Visit: Payer: Self-pay | Admitting: Family Medicine

## 2022-09-11 DIAGNOSIS — I10 Essential (primary) hypertension: Secondary | ICD-10-CM

## 2023-03-16 ENCOUNTER — Other Ambulatory Visit: Payer: Self-pay | Admitting: Family Medicine

## 2023-03-16 DIAGNOSIS — N401 Enlarged prostate with lower urinary tract symptoms: Secondary | ICD-10-CM

## 2023-06-16 ENCOUNTER — Other Ambulatory Visit: Payer: Self-pay | Admitting: Family Medicine

## 2023-06-16 DIAGNOSIS — N401 Enlarged prostate with lower urinary tract symptoms: Secondary | ICD-10-CM

## 2023-09-15 ENCOUNTER — Other Ambulatory Visit: Payer: Self-pay | Admitting: Family Medicine

## 2023-09-15 DIAGNOSIS — N401 Enlarged prostate with lower urinary tract symptoms: Secondary | ICD-10-CM

## 2023-09-21 ENCOUNTER — Other Ambulatory Visit: Payer: Self-pay | Admitting: Family Medicine

## 2023-09-21 DIAGNOSIS — N401 Enlarged prostate with lower urinary tract symptoms: Secondary | ICD-10-CM

## 2023-09-24 ENCOUNTER — Other Ambulatory Visit: Payer: Self-pay | Admitting: Family Medicine

## 2023-09-24 DIAGNOSIS — N401 Enlarged prostate with lower urinary tract symptoms: Secondary | ICD-10-CM

## 2023-09-25 ENCOUNTER — Other Ambulatory Visit: Payer: Self-pay | Admitting: Family Medicine

## 2023-09-25 DIAGNOSIS — N401 Enlarged prostate with lower urinary tract symptoms: Secondary | ICD-10-CM

## 2023-09-28 ENCOUNTER — Encounter: Payer: Self-pay | Admitting: Family Medicine

## 2023-09-29 ENCOUNTER — Ambulatory Visit: Payer: BC Managed Care – PPO | Admitting: Family Medicine

## 2023-09-29 VITALS — BP 164/98 | HR 81 | Temp 97.9°F | Ht 73.0 in | Wt 193.6 lb

## 2023-09-29 DIAGNOSIS — Z0000 Encounter for general adult medical examination without abnormal findings: Secondary | ICD-10-CM | POA: Diagnosis not present

## 2023-09-29 DIAGNOSIS — F17210 Nicotine dependence, cigarettes, uncomplicated: Secondary | ICD-10-CM | POA: Diagnosis not present

## 2023-09-29 DIAGNOSIS — E782 Mixed hyperlipidemia: Secondary | ICD-10-CM

## 2023-09-29 DIAGNOSIS — R3911 Hesitancy of micturition: Secondary | ICD-10-CM

## 2023-09-29 DIAGNOSIS — Z131 Encounter for screening for diabetes mellitus: Secondary | ICD-10-CM

## 2023-09-29 DIAGNOSIS — I739 Peripheral vascular disease, unspecified: Secondary | ICD-10-CM

## 2023-09-29 DIAGNOSIS — N401 Enlarged prostate with lower urinary tract symptoms: Secondary | ICD-10-CM | POA: Diagnosis not present

## 2023-09-29 DIAGNOSIS — I10 Essential (primary) hypertension: Secondary | ICD-10-CM

## 2023-09-29 DIAGNOSIS — R972 Elevated prostate specific antigen [PSA]: Secondary | ICD-10-CM

## 2023-09-29 MED ORDER — TAMSULOSIN HCL 0.4 MG PO CAPS: 0.4000 mg | ORAL_CAPSULE | Freq: Every day | ORAL | 0 refills | Status: DC

## 2023-09-29 NOTE — Progress Notes (Addendum)
Established Patient Office Visit   Subjective:  Patient ID: Leslie Harrington, male    DOB: 05/08/1956  Age: 67 y.o. MRN: [id number]  Chief Complaint  Patient presents with   Medical Management of Chronic Issues    Over due follow up and medication refill for flomax.     HPI Encounter Diagnoses  Name Primary?   Healthcare maintenance Yes   Benign prostatic hyperplasia with urinary hesitancy    Essential hypertension    Cigarette smoker    Mixed hyperlipidemia    Intermittent claudication (HCC)    Screening for diabetes mellitus    Elevated PSA    For a physical and follow-up of above.  He is fasting.  He decided not to start the irbesartan given for hypertension at last visit.  He does continue with tamsulosin which does help his urine flow.  He would like to continue it.  He never returned for the blood work that was ordered at that visit.  He has a 50-pack-year history.  History of hyperlipidemia.  He does report pain in his legs after walking a short distance that resolves promptly with rest.   Review of Systems  Constitutional: Negative.   HENT: Negative.    Eyes:  Negative for blurred vision, discharge and redness.  Respiratory: Negative.  Negative for shortness of breath.   Cardiovascular: Negative.   Gastrointestinal:  Negative for abdominal pain, blood in stool, constipation, diarrhea and melena.  Genitourinary: Negative.   Musculoskeletal: Negative.  Negative for myalgias.  Skin:  Negative for rash.  Neurological:  Negative for tingling, loss of consciousness, weakness and headaches.  Endo/Heme/Allergies:  Negative for polydipsia.     Current Outpatient Medications:    aspirin EC 81 MG tablet, Take 81 mg by mouth daily., Disp: , Rfl:    cyclobenzaprine (FLEXERIL) 10 MG tablet, Take 1 tablet (10 mg total) by mouth at bedtime., Disp: 30 tablet, Rfl: 0   meloxicam (MOBIC) 15 MG tablet, Take 1 tablet (15 mg total) by mouth daily. For 2 weeks with food and then as  needed., Disp: 30 tablet, Rfl: 0   irbesartan (AVAPRO) 75 MG tablet, TAKE ONE TABLET BY MOUTH DAILY (Patient not taking: Reported on 09/29/2023), Disp: 30 tablet, Rfl: 1   sildenafil (REVATIO) 20 MG tablet, May take 1-5 pills 45 minutes prior daily as needed. (Patient not taking: Reported on 09/29/2023), Disp: 50 tablet, Rfl: 1   tamsulosin (FLOMAX) 0.4 MG CAPS capsule, Take 1 capsule (0.4 mg total) by mouth daily., Disp: 90 capsule, Rfl: 0   Objective:     BP (!) 164/98   Pulse 81   Temp 97.9 F (36.6 C)   Ht 6\' 1"  (1.854 m)   Wt 193 lb 9.6 oz (87.8 kg)   SpO2 98%   BMI 25.54 kg/m    Physical Exam Constitutional:      General: He is not in acute distress.    Appearance: Normal appearance. He is not ill-appearing, toxic-appearing or diaphoretic.  HENT:     Head: Normocephalic and atraumatic.     Right Ear: External ear normal.     Left Ear: External ear normal.     Mouth/Throat:     Mouth: Mucous membranes are moist.     Pharynx: Oropharynx is clear. No oropharyngeal exudate or posterior oropharyngeal erythema.  Eyes:     General: No scleral icterus.       Right eye: No discharge.        Left eye: No  discharge.     Extraocular Movements: Extraocular movements intact.     Conjunctiva/sclera: Conjunctivae normal.     Pupils: Pupils are equal, round, and reactive to light.  Cardiovascular:     Rate and Rhythm: Normal rate and regular rhythm.  Pulmonary:     Effort: Pulmonary effort is normal. No respiratory distress.     Breath sounds: Normal breath sounds. Decreased air movement present. No wheezing or rales.  Abdominal:     General: Bowel sounds are normal.     Tenderness: There is no abdominal tenderness. There is no guarding or rebound.  Musculoskeletal:     Cervical back: No rigidity or tenderness.  Skin:    General: Skin is warm and dry.  Neurological:     Mental Status: He is alert and oriented to person, place, and time.  Psychiatric:        Mood and Affect:  Mood normal.        Behavior: Behavior normal.      Results for orders placed or performed in visit on 09/29/23  CBC  Result Value Ref Range   WBC 8.2 4.0 - 10.5 K/uL   RBC 5.23 4.22 - 5.81 Mil/uL   Platelets 233.0 150.0 - 400.0 K/uL   Hemoglobin 15.4 13.0 - 17.0 g/dL   HCT 16.1 09.6 - 04.5 %   MCV 87.4 78.0 - 100.0 fl   MCHC 33.7 30.0 - 36.0 g/dL   RDW 40.9 81.1 - 91.4 %  Lipid panel  Result Value Ref Range   Cholesterol 180 0 - 200 mg/dL   Triglycerides 78.2 0.0 - 149.0 mg/dL   HDL 95.62 >13.08 mg/dL   VLDL 9.6 0.0 - 65.7 mg/dL   LDL Cholesterol 846 (H) 0 - 99 mg/dL   Total CHOL/HDL Ratio 4    NonHDL 133.29   PSA  Result Value Ref Range   PSA 7.44 (H) 0.10 - 4.00 ng/mL  Urinalysis, Routine w reflex microscopic  Result Value Ref Range   Color, Urine YELLOW Yellow;Lt. Yellow;Straw;Dark Yellow;Amber;Green;Red;Brown   APPearance CLEAR Clear;Turbid;Slightly Cloudy;Cloudy   Specific Gravity, Urine 1.020 1.000 - 1.030   pH 7.0 5.0 - 8.0   Total Protein, Urine NEGATIVE Negative   Urine Glucose NEGATIVE Negative   Ketones, ur NEGATIVE Negative   Bilirubin Urine NEGATIVE Negative   Hgb urine dipstick SMALL (A) Negative   Urobilinogen, UA 0.2 0.0 - 1.0   Leukocytes,Ua NEGATIVE Negative   Nitrite NEGATIVE Negative   WBC, UA 3-6/hpf (A) 0-2/hpf   RBC / HPF 3-6/hpf (A) 0-2/hpf   Mucus, UA Presence of (A) None  Hemoglobin A1c  Result Value Ref Range   Hgb A1c MFr Bld 5.8 4.6 - 6.5 %  Comprehensive metabolic panel  Result Value Ref Range   Sodium 137 135 - 145 mEq/L   Potassium 4.0 3.5 - 5.1 mEq/L   Chloride 102 96 - 112 mEq/L   CO2 27 19 - 32 mEq/L   Glucose, Bld 77 70 - 99 mg/dL   BUN 16 6 - 23 mg/dL   Creatinine, Ser 9.62 0.40 - 1.50 mg/dL   Total Bilirubin 0.6 0.2 - 1.2 mg/dL   Alkaline Phosphatase 80 39 - 117 U/L   AST 14 0 - 37 U/L   ALT 12 0 - 53 U/L   Total Protein 6.6 6.0 - 8.3 g/dL   Albumin 4.1 3.5 - 5.2 g/dL   GFR 95.28 >41.32 mL/min   Calcium 9.3 8.4 -  10.5 mg/dL  The 10-year ASCVD risk score (Arnett DK, et al., 2019) is: 27.5%    Assessment & Plan:   Healthcare maintenance -     Urinalysis, Routine w reflex microscopic  Benign prostatic hyperplasia with urinary hesitancy -     Tamsulosin HCl; Take 1 capsule (0.4 mg total) by mouth daily.  Dispense: 90 capsule; Refill: 0 -     PSA  Essential hypertension -     CBC -     Comprehensive metabolic panel  Cigarette smoker -     Ambulatory Referral for Lung Cancer Scre  Mixed hyperlipidemia -     Lipid panel -     Comprehensive metabolic panel  Intermittent claudication (HCC) -     VAS [place] ABI WITH/WO TBI; Future -     Ambulatory referral to Vascular Surgery  Screening for diabetes mellitus -     Hemoglobin A1c  Elevated PSA    Return in about 6 weeks (around 11/10/2023).  Patient will go ahead and start the irbesartan prescribed back in April.  Suspect vascular disease in his lower extremities.  Will send for ABIs.  Screening CT for 50-year pack history.  Restart tamsulosin.  Information given on managing hypertension.  Information given on managing the challenges of quitting smoking.  Will discuss more about cessation at next visit.  Information given on health maintenance and disease prevention.  Mliss Sax, MD

## 2023-09-30 LAB — COMPREHENSIVE METABOLIC PANEL
ALT: 12 U/L (ref 0–53)
AST: 14 U/L (ref 0–37)
Albumin: 4.1 g/dL (ref 3.5–5.2)
Alkaline Phosphatase: 80 U/L (ref 39–117)
BUN: 16 mg/dL (ref 6–23)
CO2: 27 meq/L (ref 19–32)
Calcium: 9.3 mg/dL (ref 8.4–10.5)
Chloride: 102 meq/L (ref 96–112)
Creatinine, Ser: 1.06 mg/dL (ref 0.40–1.50)
GFR: 73.99 mL/min (ref >60.00)
Glucose, Bld: 77 mg/dL (ref 70–99)
Potassium: 4 meq/L (ref 3.5–5.1)
Sodium: 137 meq/L (ref 135–145)
Total Bilirubin: 0.6 mg/dL (ref 0.2–1.2)
Total Protein: 6.6 g/dL (ref 6.0–8.3)

## 2023-09-30 LAB — CBC
HCT: 45.7 % (ref 39.0–52.0)
Hemoglobin: 15.4 g/dL (ref 13.0–17.0)
MCHC: 33.7 g/dL (ref 30.0–36.0)
MCV: 87.4 fL (ref 78.0–100.0)
Platelets: 233 10*3/uL (ref 150.0–400.0)
RBC: 5.23 Mil/uL (ref 4.22–5.81)
RDW: 14.8 % (ref 11.5–15.5)
WBC: 8.2 10*3/uL (ref 4.0–10.5)

## 2023-09-30 LAB — URINALYSIS, ROUTINE W REFLEX MICROSCOPIC
Bilirubin Urine: NEGATIVE
Ketones, ur: NEGATIVE
Leukocytes,Ua: NEGATIVE
Nitrite: NEGATIVE
Specific Gravity, Urine: 1.02 (ref 1.000–1.030)
Total Protein, Urine: NEGATIVE
Urine Glucose: NEGATIVE
Urobilinogen, UA: 0.2 (ref 0.0–1.0)
pH: 7 (ref 5.0–8.0)

## 2023-09-30 LAB — LIPID PANEL
Cholesterol: 180 mg/dL (ref 0–200)
HDL: 46.8 mg/dL (ref >39.00)
LDL Cholesterol: 124 mg/dL — ABNORMAL HIGH (ref 0–99)
NonHDL: 133.29
Total CHOL/HDL Ratio: 4
Triglycerides: 48 mg/dL (ref 0.0–149.0)
VLDL: 9.6 mg/dL (ref 0.0–40.0)

## 2023-09-30 LAB — HEMOGLOBIN A1C: Hgb A1c MFr Bld: 5.8 % (ref 4.6–6.5)

## 2023-09-30 LAB — PSA: PSA: 7.44 ng/mL — ABNORMAL HIGH (ref 0.10–4.00)

## 2023-10-01 DIAGNOSIS — R972 Elevated prostate specific antigen [PSA]: Secondary | ICD-10-CM | POA: Insufficient documentation

## 2023-10-15 ENCOUNTER — Telehealth: Payer: Self-pay | Admitting: Family Medicine

## 2023-10-15 NOTE — Telephone Encounter (Signed)
Melissa from [facility] called and said that the pt has Leslie Harrington and they need a prioauthorization for the procedure being done on 11.25.24 at the vascular. Melissa can be reached at [phone] ext [phone]. Please call her today

## 2023-10-15 NOTE — Telephone Encounter (Signed)
Done  Order ID: [id number]       Authorized  Approval Valid Through: 10/15/2023 - 11/13/2023

## 2023-10-19 ENCOUNTER — Ambulatory Visit (HOSPITAL_COMMUNITY)
Admission: RE | Admit: 2023-10-19 | Discharge: 2023-10-19 | Disposition: A | Discharge status: Home / Self Care | Payer: BC Managed Care – PPO | Source: Ambulatory Visit | Attending: Family Medicine | Admitting: Family Medicine

## 2023-10-19 DIAGNOSIS — I739 Peripheral vascular disease, unspecified: Secondary | ICD-10-CM | POA: Diagnosis not present

## 2023-10-19 NOTE — Progress Notes (Signed)
Ankle-brachial index completed. Please see CV Procedures for preliminary results.  Shona Simpson, RVT 10/19/23 10:17 AM

## 2023-10-20 LAB — VAS US ABI WITH/WO TBI
Left ABI: 0.64
Right ABI: 0.61

## 2023-10-26 NOTE — Addendum Note (Signed)
Addended by: Andrez Grime on: 10/26/2023 09:52 AM   Modules accepted: Orders

## 2023-11-09 ENCOUNTER — Encounter: Payer: Self-pay | Admitting: Family Medicine

## 2023-11-09 ENCOUNTER — Ambulatory Visit: Payer: BC Managed Care – PPO | Admitting: Family Medicine

## 2023-11-09 VITALS — BP 144/78 | HR 89 | Temp 98.1°F | Ht 73.0 in | Wt 195.8 lb

## 2023-11-09 DIAGNOSIS — I10 Essential (primary) hypertension: Secondary | ICD-10-CM | POA: Diagnosis not present

## 2023-11-09 DIAGNOSIS — I739 Peripheral vascular disease, unspecified: Secondary | ICD-10-CM | POA: Diagnosis not present

## 2023-11-09 DIAGNOSIS — E782 Mixed hyperlipidemia: Secondary | ICD-10-CM | POA: Diagnosis not present

## 2023-11-09 DIAGNOSIS — F17210 Nicotine dependence, cigarettes, uncomplicated: Secondary | ICD-10-CM | POA: Diagnosis not present

## 2023-11-09 MED ORDER — AMLODIPINE BESYLATE 5 MG PO TABS: 5.0000 mg | ORAL_TABLET | Freq: Every day | ORAL | 1 refills | Status: DC

## 2023-11-09 NOTE — Progress Notes (Signed)
Established Patient Office Visit   Subjective:  Patient ID: Leslie Harrington, male    DOB: Jan 21, 1956  Age: 67 y.o. MRN: [id number]  No chief complaint on file.   HPI Encounter Diagnoses  Name Primary?   Essential hypertension Yes   PVD (peripheral vascular disease) (HCC)    Mixed hyperlipidemia    Cigarette smoker    For follow-up of above.  Urine flow is much improved callosum.  Continue it.  Discussed ABI results.  No issues with irbesartan.  He has claudication symptoms in his legs are more of a tiredness.  Fortunately he has no pain in his legs with exercise at this time   Review of Systems  Constitutional: Negative.   HENT: Negative.    Eyes:  Negative for blurred vision, discharge and redness.  Respiratory: Negative.  Negative for shortness of breath.   Cardiovascular: Negative.  Negative for chest pain.  Gastrointestinal:  Negative for abdominal pain.  Genitourinary: Negative.   Musculoskeletal: Negative.  Negative for myalgias.  Skin:  Negative for rash.  Neurological:  Negative for tingling, loss of consciousness and weakness.  Endo/Heme/Allergies:  Negative for polydipsia.     Current Outpatient Medications:    amLODipine (NORVASC) 5 MG tablet, Take 1 tablet (5 mg total) by mouth daily., Disp: 90 tablet, Rfl: 1   aspirin EC 81 MG tablet, Take 81 mg by mouth daily., Disp: , Rfl:    cyclobenzaprine (FLEXERIL) 10 MG tablet, Take 1 tablet (10 mg total) by mouth at bedtime., Disp: 30 tablet, Rfl: 0   meloxicam (MOBIC) 15 MG tablet, Take 1 tablet (15 mg total) by mouth daily. For 2 weeks with food and then as needed., Disp: 30 tablet, Rfl: 0   tamsulosin (FLOMAX) 0.4 MG CAPS capsule, Take 1 capsule (0.4 mg total) by mouth daily., Disp: 90 capsule, Rfl: 0   irbesartan (AVAPRO) 75 MG tablet, TAKE ONE TABLET BY MOUTH DAILY (Patient not taking: Reported on 11/09/2023), Disp: 30 tablet, Rfl: 1   sildenafil (REVATIO) 20 MG tablet, May take 1-5 pills 45 minutes prior daily as  needed. (Patient not taking: Reported on 11/09/2023), Disp: 50 tablet, Rfl: 1   Objective:     BP (!) 144/78   Pulse 89   Temp 98.1 F (36.7 C)   Ht 6\' 1"  (1.854 m)   Wt 195 lb 12.8 oz (88.8 kg)   SpO2 100%   BMI 25.83 kg/m    Physical Exam Constitutional:      General: He is not in acute distress.    Appearance: Normal appearance. He is not ill-appearing, toxic-appearing or diaphoretic.  HENT:     Head: Normocephalic and atraumatic.     Right Ear: External ear normal.     Left Ear: External ear normal.  Eyes:     General: No scleral icterus.       Right eye: No discharge.        Left eye: No discharge.     Extraocular Movements: Extraocular movements intact.     Conjunctiva/sclera: Conjunctivae normal.  Pulmonary:     Effort: Pulmonary effort is normal. No respiratory distress.  Skin:    General: Skin is warm and dry.  Neurological:     Mental Status: He is alert and oriented to person, place, and time.  Psychiatric:        Mood and Affect: Mood normal.        Behavior: Behavior normal.      No results found for any  visits on 11/09/23.    The 10-year ASCVD risk score (Arnett DK, et al., 2019) is: 22.4%    Assessment & Plan:   Essential hypertension -     amLODIPine Besylate; Take 1 tablet (5 mg total) by mouth daily.  Dispense: 90 tablet; Refill: 1  PVD (peripheral vascular disease) (HCC) -     amLODIPine Besylate; Take 1 tablet (5 mg total) by mouth daily.  Dispense: 90 tablet; Refill: 1  Mixed hyperlipidemia  Cigarette smoker    Return in about 3 months (around 02/07/2024).  Increased stress at work.  Not ready to quit smoking at this time.  Discussed starting a statin and he would like to hold off and add 1 drug at a time which today will be amlodipine to help with his pressure and arterial dilation.  Discussed checking coronary artery calcium score.  He will consider it later today.  Has follow-up with vascular surgery in a few weeks.  Information  was given on managing tobacco cessation as well as peripheral vascular disease.   Mliss Sax, MD

## 2023-12-14 ENCOUNTER — Ambulatory Visit: Payer: BC Managed Care – PPO | Admitting: Surgery

## 2023-12-14 ENCOUNTER — Encounter: Payer: Self-pay | Admitting: Surgery

## 2023-12-14 VITALS — BP 157/88 | HR 85 | Temp 98.4°F | Resp 20 | Ht 73.0 in | Wt 193.0 lb

## 2023-12-14 DIAGNOSIS — I70213 Atherosclerosis of native arteries of extremities with intermittent claudication, bilateral legs: Secondary | ICD-10-CM | POA: Diagnosis not present

## 2023-12-14 NOTE — Progress Notes (Signed)
[facility] [place]  Patient name: Leslie Harrington MRN: [id number] DOB: August 08, 1956 Sex: male   REQUESTING PROVIDER:    Dr. Doreene Burke   REASON FOR CONSULT:    claudication  HISTORY OF PRESENT ILLNESS:   Leslie Harrington is a 68 y.o. male, who is referred for evaluation of bilateral claudication.  The patient states that he began having symptoms about a year ago.  He says that he can walk around his house without difficulty however when he goes to get his mail, he gets leg pain on the way back.  He describes this like a feeling you get after working out like lactic acid is building up in his legs.  His symptoms go away with rest.  He does not have rest pain or nonhealing wound.  The patient has had 2 episodes of left leg swelling however he has never been diagnosed with a DVT.  He has been diagnosed with hypertension and hyperlipidemia.  He is a current smoker.  PAST MEDICAL HISTORY    Past Medical History:  Diagnosis Date   Acute bronchitis 09/17/2007   ALLERGIC RHINITIS 05/10/2010   ANXIETY DEPRESSION 08/08/2010   ASTHMA, CHILDHOOD 09/17/2007   CONJUNCTIVITIS, ACUTE, BILATERAL 05/10/2010   ERECTILE DYSFUNCTION 09/17/2007   HYPERLIPIDEMIA 08/08/2010   OLECRANON BURSITIS, LEFT 08/08/2010   SINUSITIS- ACUTE-NOS 02/07/2009   Unspecified hearing loss 02/07/2009   Wheezing 01/04/2010     FAMILY HISTORY   Family History  Problem Relation Age of Onset   Diabetes Other        1st degree relative   Hyperlipidemia Other    Hypertension Other    Heart disease Maternal Grandfather    Heart disease Paternal Grandfather    Heart disease Maternal Uncle    Colon cancer Neg Hx     SOCIAL HISTORY:   Social History   Socioeconomic History   Marital status: Significant Other    Spouse name: Not on file   Number of children: 2   Years of education: Not on file   Highest education level: 12th grade  Occupational History   Not on  file  Tobacco Use   Smoking status: Every Day    Current packs/day: 1.00    Average packs/day: 1 pack/day for 40.0 years (40.0 ttl pk-yrs)    Types: Cigarettes   Smokeless tobacco: Never  Vaping Use   Vaping status: Never Used  Substance and Sexual Activity   Alcohol use: Yes    Comment: rare   Drug use: No   Sexual activity: Not Currently  Other Topics Concern   Not on file  Social History Narrative   Not on file   Social Drivers of Health   Financial Resource Strain: Low Risk  (11/06/2023)   Overall Financial Resource Strain (CARDIA)    Difficulty of Paying Living Expenses: Not very hard  Food Insecurity: No Food Insecurity (11/06/2023)   Hunger Vital Sign    Worried About Running Out of Food in the Last Year: Never true    Ran Out of Food in the Last Year: Never true  Transportation Needs: No Transportation Needs (11/06/2023)   PRAPARE - Administrator, Civil Service (Medical): No    Lack of Transportation (Non-Medical): No  Physical Activity: Unknown (11/06/2023)   Exercise Vital Sign    Days of Exercise per Week: 0 days    Minutes of Exercise per Session: Not on file  Stress: Stress Concern Present (11/06/2023)   [place]  [employer] - Occupational Stress Questionnaire    Feeling of Stress : To some extent  Social Connections: Socially Isolated (11/06/2023)   Social Connection and Isolation Panel [NHANES]    Frequency of Communication with Friends and Family: Once a week    Frequency of Social Gatherings with Friends and Family: Once a week    Attends Religious Services: Never    Database administrator or Organizations: No    Attends Engineer, structural: Not on file    Marital Status: Living with partner  Intimate Partner Violence: Not on file    ALLERGIES:    No Known Allergies  CURRENT MEDICATIONS:    Current Outpatient Medications  Medication Sig Dispense Refill   amLODipine (NORVASC) 5 MG tablet Take 1  tablet (5 mg total) by mouth daily. 90 tablet 1   aspirin EC 81 MG tablet Take 81 mg by mouth daily.     cyclobenzaprine (FLEXERIL) 10 MG tablet Take 1 tablet (10 mg total) by mouth at bedtime. 30 tablet 0   irbesartan (AVAPRO) 75 MG tablet TAKE ONE TABLET BY MOUTH DAILY 30 tablet 1   meloxicam (MOBIC) 15 MG tablet Take 1 tablet (15 mg total) by mouth daily. For 2 weeks with food and then as needed. 30 tablet 0   sildenafil (REVATIO) 20 MG tablet May take 1-5 pills 45 minutes prior daily as needed. 50 tablet 1   tamsulosin (FLOMAX) 0.4 MG CAPS capsule Take 1 capsule (0.4 mg total) by mouth daily. 90 capsule 0   No current facility-administered medications for this visit.    REVIEW OF SYSTEMS:   [X]  denotes positive finding, [ ]  denotes negative finding Cardiac  Comments:  Chest pain or chest pressure:    Shortness of breath upon exertion:    Short of breath when lying flat:    Irregular heart rhythm:        Vascular    Pain in calf, thigh, or hip brought on by ambulation: x   Pain in feet at night that wakes you up from your sleep:     Blood clot in your veins:    Leg swelling:         Pulmonary    Oxygen at home:    Productive cough:     Wheezing:         Neurologic    Sudden weakness in arms or legs:     Sudden numbness in arms or legs:     Sudden onset of difficulty speaking or slurred speech:    Temporary loss of vision in one eye:     Problems with dizziness:         Gastrointestinal    Blood in stool:      Vomited blood:         Genitourinary    Burning when urinating:     Blood in urine:        Psychiatric    Major depression:         Hematologic    Bleeding problems:    Problems with blood clotting too easily:        Skin    Rashes or ulcers:        Constitutional    Fever or chills:     PHYSICAL EXAM:   Vitals:   12/14/23 0913  BP: (!) 157/88  Pulse: 85  Resp: 20  Temp: 98.4 F (36.9 C)  SpO2: 98%  Weight: 193 lb (87.5  kg)  Height: 6\' 1"   (1.854 m)    GENERAL: The patient is a well-nourished male, in no acute distress. The vital signs are documented above. CARDIAC: There is a regular rate and rhythm.  VASCULAR: Palpable femoral pulses.  Nonpalpable pedal pulses PULMONARY: Nonlabored respirations ABDOMEN: Soft and non-tender with no pulsatile mass MUSCULOSKELETAL: There are no major deformities or cyanosis. NEUROLOGIC: No focal weakness or paresthesias are detected. SKIN: There are no ulcers or rashes noted. PSYCHIATRIC: The patient has a normal affect.  STUDIES:   I have reviewed the following:  +-------+-----------+-----------+------------+------------+  ABI/TBIToday's ABIToday's TBIPrevious ABIPrevious TBI  +-------+-----------+-----------+------------+------------+  Right 0.61       0.43                                 +-------+-----------+-----------+------------+------------+  Left  0.64       0.59                                 +-------+-----------+-----------+------------+------------+  Right toe pressure: 75 Left toe pressure: 102 Waveforms are monophasic  ASSESSMENT and PLAN   PAD with claudication: I stressed the importance of medical management today.  I have encouraged him to take a 81 mg aspirin as well as to start a statin for hypercholesterolemia with a target LDL less than 70.  We also talked about a exercise program.  I stressed the importance of smoking cessation.  I considered starting cilostazol, however he is not big on medications and so I will not add that today but could try that in the future if he has issues.  I have him scheduled for follow-up in about 3 months to see how he is doing with his smoking cessation and exercise program.   Durene Cal, IV, MD, FACS [facility] [phone] Pager [phone]

## 2023-12-25 ENCOUNTER — Other Ambulatory Visit: Payer: Self-pay | Admitting: Family Medicine

## 2023-12-25 DIAGNOSIS — N401 Enlarged prostate with lower urinary tract symptoms: Secondary | ICD-10-CM

## 2023-12-26 ENCOUNTER — Other Ambulatory Visit: Payer: Self-pay | Admitting: Family Medicine

## 2023-12-26 DIAGNOSIS — N401 Enlarged prostate with lower urinary tract symptoms: Secondary | ICD-10-CM

## 2024-02-08 ENCOUNTER — Ambulatory Visit: Payer: BC Managed Care – PPO | Admitting: Family Medicine

## 2024-02-08 ENCOUNTER — Encounter: Payer: Self-pay | Admitting: Family Medicine

## 2024-02-08 VITALS — BP 140/78 | HR 97 | Temp 98.4°F | Ht 73.0 in | Wt 190.0 lb

## 2024-02-08 DIAGNOSIS — I739 Peripheral vascular disease, unspecified: Secondary | ICD-10-CM

## 2024-02-08 DIAGNOSIS — F17210 Nicotine dependence, cigarettes, uncomplicated: Secondary | ICD-10-CM

## 2024-02-08 DIAGNOSIS — I10 Essential (primary) hypertension: Secondary | ICD-10-CM

## 2024-02-08 DIAGNOSIS — G4762 Sleep related leg cramps: Secondary | ICD-10-CM

## 2024-02-08 MED ORDER — ATORVASTATIN CALCIUM 10 MG PO TABS: 10.0000 mg | ORAL_TABLET | Freq: Every day | ORAL | 3 refills | Status: AC

## 2024-02-08 MED ORDER — AMLODIPINE BESYLATE 5 MG PO TABS: 5.0000 mg | ORAL_TABLET | Freq: Every day | ORAL | 1 refills | Status: DC

## 2024-02-08 NOTE — Progress Notes (Signed)
 Established Patient Office Visit   Subjective:  Patient ID: Leslie Harrington, male    DOB: 1956-08-24  Age: 68 y.o. MRN: [id number]  Chief Complaint  Patient presents with   Medical Management of Chronic Issues    3 month follow up    HPI Encounter Diagnoses  Name Primary?   Cigarette smoker Yes   Essential hypertension    PVD (peripheral vascular disease) (HCC)    Nocturnal leg cramps    For follow-up of above.  Continues to smoke about a pack a day.  Blood pressure well-controlled with amlodipine and irbesartan.  Cardiac stress test in 2021 without chest pain.  No recent chest pain or shortness of breath.  Has been experiencing nocturnal cramps.  These are different from his claudication pains.  Has follow-up scheduled with vascular surgery in May   Review of Systems  Constitutional: Negative.   HENT: Negative.    Eyes:  Negative for blurred vision, discharge and redness.  Respiratory: Negative.  Negative for shortness of breath.   Cardiovascular: Negative.  Negative for chest pain.  Gastrointestinal:  Negative for abdominal pain.  Genitourinary: Negative.   Musculoskeletal: Negative.  Negative for myalgias.  Skin:  Negative for rash.  Neurological:  Negative for tingling, loss of consciousness and weakness.  Endo/Heme/Allergies:  Negative for polydipsia.     Current Outpatient Medications:    aspirin EC 81 MG tablet, Take 81 mg by mouth daily., Disp: , Rfl:    atorvastatin (LIPITOR) 10 MG tablet, Take 1 tablet (10 mg total) by mouth daily., Disp: 90 tablet, Rfl: 3   cyclobenzaprine (FLEXERIL) 10 MG tablet, Take 1 tablet (10 mg total) by mouth at bedtime., Disp: 30 tablet, Rfl: 0   irbesartan (AVAPRO) 75 MG tablet, TAKE ONE TABLET BY MOUTH DAILY, Disp: 30 tablet, Rfl: 1   meloxicam (MOBIC) 15 MG tablet, Take 1 tablet (15 mg total) by mouth daily. For 2 weeks with food and then as needed., Disp: 30 tablet, Rfl: 0   sildenafil (REVATIO) 20 MG tablet, May take 1-5 pills 45  minutes prior daily as needed., Disp: 50 tablet, Rfl: 1   tamsulosin (FLOMAX) 0.4 MG CAPS capsule, TAKE 1 CAPSULE BY MOUTH DAILY, Disp: 90 capsule, Rfl: 0   amLODipine (NORVASC) 5 MG tablet, Take 1 tablet (5 mg total) by mouth daily., Disp: 90 tablet, Rfl: 1   Objective:     BP (!) 140/78   Pulse 97   Temp 98.4 F (36.9 C)   Ht 6\' 1"  (1.854 m)   Wt 190 lb (86.2 kg)   SpO2 97%   BMI 25.07 kg/m    Physical Exam Constitutional:      General: He is not in acute distress.    Appearance: Normal appearance. He is not ill-appearing, toxic-appearing or diaphoretic.  HENT:     Head: Normocephalic and atraumatic.     Right Ear: External ear normal.     Left Ear: External ear normal.  Eyes:     General: No scleral icterus.       Right eye: No discharge.        Left eye: No discharge.     Extraocular Movements: Extraocular movements intact.     Conjunctiva/sclera: Conjunctivae normal.  Skin:    General: Skin is warm and dry.  Neurological:     Mental Status: He is alert and oriented to person, place, and time.  Psychiatric:        Mood and Affect: Mood normal.  Behavior: Behavior normal.   Excuse me thank   No results found for any visits on 02/08/24.    The 10-year ASCVD risk score (Arnett DK, et al., 2019) is: 22.5%    Assessment & Plan:   Cigarette smoker  Essential hypertension -     amLODIPine Besylate; Take 1 tablet (5 mg total) by mouth daily.  Dispense: 90 tablet; Refill: 1 -     Basic metabolic panel  PVD (peripheral vascular disease) (HCC) -     amLODIPine Besylate; Take 1 tablet (5 mg total) by mouth daily.  Dispense: 90 tablet; Refill: 1 -     Atorvastatin Calcium; Take 1 tablet (10 mg total) by mouth daily.  Dispense: 90 tablet; Refill: 3  Nocturnal leg cramps -     Basic metabolic panel -     Magnesium    Return in about 3 months (around 05/10/2024).  Start low-dose atorvastatin 10 mg daily.  Will report unusual myalgias.  Information was  given on the medication.  Discussed an LDL goal of 70 or better.  Refill amlodipine.  Multi vitamin with magnesium and potassium.  Could try tonic water as well.  Information was given on Chantix.  Mliss Sax, MD

## 2024-02-09 ENCOUNTER — Encounter: Payer: Self-pay | Admitting: Family Medicine

## 2024-02-09 LAB — BASIC METABOLIC PANEL
BUN: 19 mg/dL (ref 6–23)
CO2: 25 meq/L (ref 19–32)
Calcium: 9.4 mg/dL (ref 8.4–10.5)
Chloride: 105 meq/L (ref 96–112)
Creatinine, Ser: 1.08 mg/dL (ref 0.40–1.50)
GFR: 72.16 mL/min (ref >60.00)
Glucose, Bld: 83 mg/dL (ref 70–99)
Potassium: 4.3 meq/L (ref 3.5–5.1)
Sodium: 138 meq/L (ref 135–145)

## 2024-02-09 LAB — MAGNESIUM: Magnesium: 2.2 mg/dL (ref 1.5–2.5)

## 2024-03-23 ENCOUNTER — Other Ambulatory Visit: Payer: Self-pay | Admitting: Family Medicine

## 2024-03-23 DIAGNOSIS — N401 Enlarged prostate with lower urinary tract symptoms: Secondary | ICD-10-CM

## 2024-03-28 ENCOUNTER — Ambulatory Visit: Payer: BC Managed Care – PPO | Admitting: Surgery

## 2024-04-27 ENCOUNTER — Other Ambulatory Visit: Payer: Self-pay | Admitting: Family Medicine

## 2024-04-27 DIAGNOSIS — I10 Essential (primary) hypertension: Secondary | ICD-10-CM

## 2024-04-27 MED ORDER — IRBESARTAN 75 MG PO TABS: 75.0000 mg | ORAL_TABLET | Freq: Every day | ORAL | 1 refills | Status: DC

## 2024-04-27 NOTE — Telephone Encounter (Signed)
 Copied from CRM [id number]. Topic: Clinical - Medication Refill >> Apr 27, 2024 11:48 AM [name] wrote: Medication: irbesartan  (AVAPRO ) 75 MG tablet  Has the patient contacted their pharmacy? Yes  This is the patient's preferred pharmacy:  [facility] [id number] [place], [place] - [address] [place] [address] Phone: [phone] Fax: [phone]  Is this the correct pharmacy for this prescription? Yes If no, delete pharmacy and type the correct one.   Has the prescription been filled recently? No  Is the patient out of the medication? No  Has the patient been seen for an appointment in the last year OR does the patient have an upcoming appointment? Yes  Can we respond through MyChart? Yes  Agent: Please be advised that Rx refills may take up to 3 business days. We ask that you follow-up with your pharmacy.

## 2024-05-16 ENCOUNTER — Encounter: Payer: Self-pay | Admitting: Family Medicine

## 2024-05-16 ENCOUNTER — Ambulatory Visit: Admitting: Family Medicine

## 2024-05-16 VITALS — BP 142/72 | HR 90 | Temp 97.8°F | Ht 72.0 in | Wt 190.0 lb

## 2024-05-16 DIAGNOSIS — Z131 Encounter for screening for diabetes mellitus: Secondary | ICD-10-CM

## 2024-05-16 DIAGNOSIS — F17210 Nicotine dependence, cigarettes, uncomplicated: Secondary | ICD-10-CM | POA: Diagnosis not present

## 2024-05-16 DIAGNOSIS — I739 Peripheral vascular disease, unspecified: Secondary | ICD-10-CM

## 2024-05-16 DIAGNOSIS — R3911 Hesitancy of micturition: Secondary | ICD-10-CM

## 2024-05-16 DIAGNOSIS — I10 Essential (primary) hypertension: Secondary | ICD-10-CM

## 2024-05-16 DIAGNOSIS — N401 Enlarged prostate with lower urinary tract symptoms: Secondary | ICD-10-CM

## 2024-05-16 MED ORDER — TADALAFIL 5 MG PO TABS: 5.0000 mg | ORAL_TABLET | Freq: Every day | ORAL | 0 refills | Status: DC

## 2024-05-16 MED ORDER — IRBESARTAN 150 MG PO TABS: 150.0000 mg | ORAL_TABLET | Freq: Every day | ORAL | 1 refills | Status: DC

## 2024-05-16 NOTE — Progress Notes (Signed)
 Established Patient Office Visit   Subjective:  Patient ID: Leslie Harrington, male    DOB: 08/03/1956  Age: 68 y.o. MRN: [id number]  Chief Complaint  Patient presents with   Follow-up    3 months follow up, has a question about sildenafil  (REVATIO ) 20 MG tablet    HPI Encounter Diagnoses  Name Primary?   Essential hypertension Yes   PVD (peripheral vascular disease) (HCC)    Cigarette smoker    Benign prostatic hyperplasia with urinary hesitancy    Screening for diabetes mellitus    For follow-up of above.  Continues to smoking just cannot bring himself to quit at this time.  He took the atorvastatin  for a few days and coincidentally developed nausea with vomiting and diarrhea.  He then discontinued the medication.  He continues with Flomax  for BPH symptoms which has helped some.  He is interested in trying a daily Cialis.  Continues to experience burning in his calves when he walks.  He is compliant with his blood pressure medicines.  Blood pressure has been elevated over the last 3 visits.     Review of Systems  Constitutional: Negative.   HENT: Negative.    Eyes:  Negative for blurred vision, discharge and redness.  Respiratory: Negative.    Cardiovascular: Negative.   Gastrointestinal:  Negative for abdominal pain.  Genitourinary: Negative.   Musculoskeletal: Negative.  Negative for myalgias.  Skin:  Negative for rash.  Neurological:  Negative for tingling, loss of consciousness and weakness.  Endo/Heme/Allergies:  Negative for polydipsia.     Current Outpatient Medications:    amLODipine  (NORVASC ) 5 MG tablet, Take 1 tablet (5 mg total) by mouth daily., Disp: 90 tablet, Rfl: 1   aspirin EC 81 MG tablet, Take 81 mg by mouth daily., Disp: , Rfl:    atorvastatin  (LIPITOR) 10 MG tablet, Take 1 tablet (10 mg total) by mouth daily., Disp: 90 tablet, Rfl: 3   irbesartan  (AVAPRO ) 150 MG tablet, Take 1 tablet (150 mg total) by mouth daily., Disp: 90 tablet, Rfl: 1   tadalafil  (CIALIS) 5 MG tablet, Take 1 tablet (5 mg total) by mouth daily., Disp: 90 tablet, Rfl: 0   tamsulosin  (FLOMAX ) 0.4 MG CAPS capsule, TAKE 1 CAPSULE BY MOUTH DAILY, Disp: 90 capsule, Rfl: 0   cyclobenzaprine  (FLEXERIL ) 10 MG tablet, Take 1 tablet (10 mg total) by mouth at bedtime. (Patient not taking: Reported on 05/16/2024), Disp: 30 tablet, Rfl: 0   meloxicam  (MOBIC ) 15 MG tablet, Take 1 tablet (15 mg total) by mouth daily. For 2 weeks with food and then as needed. (Patient not taking: Reported on 05/16/2024), Disp: 30 tablet, Rfl: 0   Objective:     BP (!) 142/72 (BP Location: Right Arm, Patient Position: Sitting, Cuff Size: Normal)   Pulse 90   Temp 97.8 F (36.6 C)   Ht 6' (1.829 m)   Wt 190 lb (86.2 kg)   SpO2 97%   BMI 25.77 kg/m  BP Readings from Last 3 Encounters:  05/16/24 (!) 142/72  02/08/24 (!) 140/78  12/14/23 (!) 157/88   Wt Readings from Last 3 Encounters:  05/16/24 190 lb (86.2 kg)  02/08/24 190 lb (86.2 kg)  12/14/23 193 lb (87.5 kg)      Physical Exam Constitutional:      General: He is not in acute distress.    Appearance: Normal appearance. He is not ill-appearing, toxic-appearing or diaphoretic.  HENT:     Head: Normocephalic and atraumatic.  Right Ear: External ear normal.     Left Ear: External ear normal.   Eyes:     General: No scleral icterus.       Right eye: No discharge.        Left eye: No discharge.     Extraocular Movements: Extraocular movements intact.     Conjunctiva/sclera: Conjunctivae normal.   Pulmonary:     Effort: Pulmonary effort is normal. No respiratory distress.   Skin:    General: Skin is warm and dry.   Neurological:     Mental Status: He is alert and oriented to person, place, and time.   Psychiatric:        Mood and Affect: Mood normal.        Behavior: Behavior normal.      No results found for any visits on 05/16/24.    The 10-year ASCVD risk score (Arnett DK, et al., 2019) is: 23%    Assessment &  Plan:   Essential hypertension -     Comprehensive metabolic panel with GFR -     Irbesartan ; Take 1 tablet (150 mg total) by mouth daily.  Dispense: 90 tablet; Refill: 1  PVD (peripheral vascular disease) (HCC) -     Lipid panel  Cigarette smoker -     Tadalafil; Take 1 tablet (5 mg total) by mouth daily.  Dispense: 90 tablet; Refill: 0  Benign prostatic hyperplasia with urinary hesitancy  Screening for diabetes mellitus -     Hemoglobin A1c -     Comprehensive metabolic panel with GFR    Return in about 3 months (around 08/16/2024).  Have increased Avapro  to 150 mg.  Continue amlodipine  5 mg.  Discussed Chantix side effects and invited him to call me when he is ready to pick a quit date and would like to start the prescription.  He will restart the atorvastatin .  Will follow-up with the vascular surgeon as directed.  Elsie Sim Lent, MD

## 2024-05-17 ENCOUNTER — Ambulatory Visit: Payer: Self-pay | Admitting: Family Medicine

## 2024-05-17 LAB — COMPREHENSIVE METABOLIC PANEL WITH GFR
ALT: 11 U/L (ref 0–53)
AST: 14 U/L (ref 0–37)
Albumin: 4 g/dL (ref 3.5–5.2)
Alkaline Phosphatase: 65 U/L (ref 39–117)
BUN: 19 mg/dL (ref 6–23)
CO2: 25 meq/L (ref 19–32)
Calcium: 9.2 mg/dL (ref 8.4–10.5)
Chloride: 106 meq/L (ref 96–112)
Creatinine, Ser: 1.07 mg/dL (ref 0.40–1.50)
GFR: 72.84 mL/min (ref >60.00)
Glucose, Bld: 86 mg/dL (ref 70–99)
Potassium: 4 meq/L (ref 3.5–5.1)
Sodium: 137 meq/L (ref 135–145)
Total Bilirubin: 0.5 mg/dL (ref 0.2–1.2)
Total Protein: 6.7 g/dL (ref 6.0–8.3)

## 2024-05-17 LAB — LIPID PANEL
Cholesterol: 182 mg/dL (ref 0–200)
HDL: 42.7 mg/dL (ref >39.00)
LDL Cholesterol: 131 mg/dL — ABNORMAL HIGH (ref 0–99)
NonHDL: 139.35
Total CHOL/HDL Ratio: 4
Triglycerides: 43 mg/dL (ref 0.0–149.0)
VLDL: 8.6 mg/dL (ref 0.0–40.0)

## 2024-05-17 LAB — HEMOGLOBIN A1C: Hgb A1c MFr Bld: 5.9 % (ref 4.6–6.5)

## 2024-06-21 ENCOUNTER — Other Ambulatory Visit: Payer: Self-pay | Admitting: Family Medicine

## 2024-06-21 DIAGNOSIS — N401 Enlarged prostate with lower urinary tract symptoms: Secondary | ICD-10-CM

## 2024-08-11 ENCOUNTER — Other Ambulatory Visit: Payer: Self-pay | Admitting: Family Medicine

## 2024-08-11 DIAGNOSIS — F17210 Nicotine dependence, cigarettes, uncomplicated: Secondary | ICD-10-CM

## 2024-08-22 ENCOUNTER — Ambulatory Visit: Admitting: Family Medicine

## 2024-08-25 ENCOUNTER — Encounter: Payer: Self-pay | Admitting: Family Medicine

## 2024-08-25 ENCOUNTER — Ambulatory Visit: Admitting: Family Medicine

## 2024-08-25 VITALS — BP 102/62 | HR 79 | Temp 96.7°F | Ht 72.0 in | Wt 197.0 lb

## 2024-08-25 DIAGNOSIS — F17210 Nicotine dependence, cigarettes, uncomplicated: Secondary | ICD-10-CM

## 2024-08-25 DIAGNOSIS — I10 Essential (primary) hypertension: Secondary | ICD-10-CM

## 2024-08-25 DIAGNOSIS — G4762 Sleep related leg cramps: Secondary | ICD-10-CM | POA: Diagnosis not present

## 2024-08-25 MED ORDER — IRBESARTAN 75 MG PO TABS: 75.0000 mg | ORAL_TABLET | Freq: Every day | ORAL | 1 refills | Status: AC

## 2024-08-25 NOTE — Progress Notes (Signed)
 Established Patient Office Visit   Subjective:  Patient ID: Leslie Harrington, male    DOB: 1956/01/12  Age: 68 y.o. MRN: [id number]  Chief Complaint  Patient presents with   Hypertension    3 mon follow up for a BP check. Pt states he has been a little shaky( not physically) doesn't know if it has anything to do with avapro  150 mg. Pt state he gets shaky fairly often. Pt states he feels off balance and weaker than usual. Pt is in compliance with his atorvastatin . Pt forgot the date of his colonoscopy, states he has it done across from [facility] ( forgot the name of the place). Pt doesn't want flu shot, doesn't want Pneumococcal Vaccine at the moment.      Hypertension Pertinent negatives include no blurred vision.   Encounter Diagnoses  Name Primary?   Essential hypertension Yes   Nocturnal leg cramps    Cigarette smoker    Has experienced episodes of lightheadedness while taking the higher dose of Avapro  with the amlodipine .  Believes that he is staying well-hydrated.  Rarely drinks alcohol.  Weight has increased.  Continues to smoke about a pack of cigarettes daily.  Continues with nighttime muscle cramps.   Review of Systems  Constitutional: Negative.   HENT: Negative.    Eyes:  Negative for blurred vision, discharge and redness.  Respiratory: Negative.    Cardiovascular: Negative.   Gastrointestinal:  Negative for abdominal pain.  Genitourinary: Negative.   Musculoskeletal: Negative.  Negative for myalgias.  Skin:  Negative for rash.  Neurological:  Negative for tingling, loss of consciousness and weakness.  Endo/Heme/Allergies:  Negative for polydipsia.     Current Outpatient Medications:    amLODipine  (NORVASC ) 5 MG tablet, Take 1 tablet (5 mg total) by mouth daily., Disp: 90 tablet, Rfl: 1   aspirin EC 81 MG tablet, Take 81 mg by mouth daily., Disp: , Rfl:    atorvastatin  (LIPITOR) 10 MG tablet, Take 1 tablet (10 mg total) by mouth daily., Disp: 90  tablet, Rfl: 3   cyclobenzaprine  (FLEXERIL ) 10 MG tablet, Take 1 tablet (10 mg total) by mouth at bedtime., Disp: 30 tablet, Rfl: 0   irbesartan  (AVAPRO ) 75 MG tablet, Take 1 tablet (75 mg total) by mouth daily., Disp: 90 tablet, Rfl: 1   meloxicam  (MOBIC ) 15 MG tablet, Take 1 tablet (15 mg total) by mouth daily. For 2 weeks with food and then as needed., Disp: 30 tablet, Rfl: 0   tadalafil  (CIALIS ) 5 MG tablet, TAKE 1 TABLET BY MOUTH DAILY, Disp: 90 tablet, Rfl: 0   tamsulosin  (FLOMAX ) 0.4 MG CAPS capsule, TAKE 1 CAPSULE BY MOUTH DAILY, Disp: 90 capsule, Rfl: 0   Objective:     BP 102/62 (BP Location: Left Arm, Patient Position: Sitting, Cuff Size: Large)   Pulse 79   Temp (!) 96.7 F (35.9 C) (Temporal)   Ht 6' (1.829 m)   Wt 197 lb (89.4 kg)   SpO2 95%   BMI 26.72 kg/m  BP Readings from Last 3 Encounters:  08/25/24 102/62  05/16/24 (!) 142/72  02/08/24 (!) 140/78   Wt Readings from Last 3 Encounters:  08/25/24 197 lb (89.4 kg)  05/16/24 190 lb (86.2 kg)  02/08/24 190 lb (86.2 kg)      Physical Exam Constitutional:      General: He is not in acute distress.    Appearance: Normal appearance. He is not ill-appearing, toxic-appearing or diaphoretic.  HENT:  Head: Normocephalic and atraumatic.     Right Ear: External ear normal.     Left Ear: External ear normal.  Eyes:     General: No scleral icterus.       Right eye: No discharge.        Left eye: No discharge.     Extraocular Movements: Extraocular movements intact.     Conjunctiva/sclera: Conjunctivae normal.  Pulmonary:     Effort: Pulmonary effort is normal. No respiratory distress.  Skin:    General: Skin is warm and dry.  Neurological:     Mental Status: He is alert and oriented to person, place, and time.  Psychiatric:        Mood and Affect: Mood normal.        Behavior: Behavior normal.      No results found for any visits on 08/25/24.    The 10-year ASCVD risk score (Arnett DK, et al., 2019)  is: 14.3%    Assessment & Plan:   Essential hypertension -     Basic metabolic panel with GFR -     Urinalysis, Routine w reflex microscopic -     Magnesium -     Irbesartan ; Take 1 tablet (75 mg total) by mouth daily.  Dispense: 90 tablet; Refill: 1  Nocturnal leg cramps  Cigarette smoker    Return in about 3 months (around 11/25/2024), or Check and record blood pressures please..  Decreased Avapro  back to 75 mg daily.  As did not check and record his blood pressures at home.  Ready to quit tobacco at this time.  Explained that ironically muscle relaxers do not help nighttime cramps.  Will need to stretch them out.   Elsie Sim Lent, MD

## 2024-08-26 LAB — BASIC METABOLIC PANEL WITH GFR
BUN: 15 mg/dL (ref 6–23)
CO2: 25 meq/L (ref 19–32)
Calcium: 9.5 mg/dL (ref 8.4–10.5)
Chloride: 104 meq/L (ref 96–112)
Creatinine, Ser: 1.03 mg/dL (ref 0.40–1.50)
GFR: 76.09 mL/min (ref >60.00)
Glucose, Bld: 81 mg/dL (ref 70–99)
Potassium: 4.2 meq/L (ref 3.5–5.1)
Sodium: 138 meq/L (ref 135–145)

## 2024-08-26 LAB — URINALYSIS, ROUTINE W REFLEX MICROSCOPIC
Bilirubin Urine: NEGATIVE
Ketones, ur: NEGATIVE
Leukocytes,Ua: NEGATIVE
Nitrite: NEGATIVE
Specific Gravity, Urine: 1.02 (ref 1.000–1.030)
Total Protein, Urine: NEGATIVE
Urine Glucose: NEGATIVE
Urobilinogen, UA: 1 (ref 0.0–1.0)
pH: 6.5 (ref 5.0–8.0)

## 2024-08-26 LAB — MAGNESIUM: Magnesium: 2.2 mg/dL (ref 1.5–2.5)

## 2024-08-29 ENCOUNTER — Ambulatory Visit: Payer: Self-pay | Admitting: Family Medicine

## 2024-09-20 ENCOUNTER — Other Ambulatory Visit: Payer: Self-pay | Admitting: Family Medicine

## 2024-09-20 DIAGNOSIS — N401 Enlarged prostate with lower urinary tract symptoms: Secondary | ICD-10-CM

## 2024-10-16 ENCOUNTER — Other Ambulatory Visit: Payer: Self-pay | Admitting: Family Medicine

## 2024-10-16 DIAGNOSIS — I739 Peripheral vascular disease, unspecified: Secondary | ICD-10-CM

## 2024-10-16 DIAGNOSIS — I10 Essential (primary) hypertension: Secondary | ICD-10-CM

## 2024-11-03 ENCOUNTER — Other Ambulatory Visit: Payer: Self-pay | Admitting: Family Medicine

## 2024-11-03 DIAGNOSIS — I10 Essential (primary) hypertension: Secondary | ICD-10-CM

## 2024-11-11 ENCOUNTER — Other Ambulatory Visit: Payer: Self-pay | Admitting: Family Medicine

## 2024-11-11 DIAGNOSIS — I10 Essential (primary) hypertension: Secondary | ICD-10-CM

## 2024-11-15 ENCOUNTER — Other Ambulatory Visit: Payer: Self-pay | Admitting: Family Medicine

## 2024-11-15 DIAGNOSIS — I10 Essential (primary) hypertension: Secondary | ICD-10-CM

## 2024-11-15 NOTE — Telephone Encounter (Signed)
 Medication was RF 08/25/24 #90, 1RF. RF requesed too soon. Request declined.

## 2024-12-01 ENCOUNTER — Ambulatory Visit: Admitting: Family Medicine

## 2024-12-01 ENCOUNTER — Encounter: Payer: Self-pay | Admitting: Family Medicine

## 2024-12-01 VITALS — BP 136/74 | HR 68 | Temp 98.1°F | Ht 72.0 in | Wt 201.2 lb

## 2024-12-01 DIAGNOSIS — F17210 Nicotine dependence, cigarettes, uncomplicated: Secondary | ICD-10-CM | POA: Diagnosis not present

## 2024-12-01 DIAGNOSIS — I10 Essential (primary) hypertension: Secondary | ICD-10-CM | POA: Diagnosis not present

## 2024-12-01 DIAGNOSIS — Z1211 Encounter for screening for malignant neoplasm of colon: Secondary | ICD-10-CM

## 2024-12-01 DIAGNOSIS — N529 Male erectile dysfunction, unspecified: Secondary | ICD-10-CM | POA: Diagnosis not present

## 2024-12-01 MED ORDER — TADALAFIL 5 MG PO TABS: 5.0000 mg | ORAL_TABLET | Freq: Every day | ORAL | 0 refills | Status: AC

## 2024-12-01 NOTE — Progress Notes (Signed)
 "  Established Patient Office Visit   Subjective:  Patient ID: Leslie Harrington, male    DOB: 08/21/1956  Age: 69 y.o. MRN: [id number]  Chief Complaint  Patient presents with   Follow-up    3 month follow up for HTN    HPI Encounter Diagnoses  Name Primary?   Essential hypertension Yes   Cigarette smoker    Screening for colon cancer    Erectile disorder    BP at home has been running in the 120s over 70s to 80s on Avapro  75 and amlodipine  5 mg.   Review of Systems  Constitutional: Negative.   HENT: Negative.    Eyes:  Negative for blurred vision, discharge and redness.  Respiratory: Negative.    Cardiovascular: Negative.   Gastrointestinal:  Negative for abdominal pain.  Genitourinary: Negative.   Musculoskeletal: Negative.  Negative for myalgias.  Skin:  Negative for rash.  Neurological:  Negative for tingling, loss of consciousness and weakness.  Endo/Heme/Allergies:  Negative for polydipsia.    Current Medications[1]   Objective:     BP 136/74 (BP Location: Left Arm, Patient Position: Sitting, Cuff Size: Large)   Pulse 68   Temp 98.1 F (36.7 C) (Oral)   Ht 6' (1.829 m)   Wt 201 lb 3.2 oz (91.3 kg)   SpO2 98%   BMI 27.29 kg/m  BP Readings from Last 3 Encounters:  12/01/24 136/74  08/25/24 102/62  05/16/24 (!) 142/72   Wt Readings from Last 3 Encounters:  12/01/24 201 lb 3.2 oz (91.3 kg)  08/25/24 197 lb (89.4 kg)  05/16/24 190 lb (86.2 kg)      Physical Exam Constitutional:      General: He is not in acute distress.    Appearance: Normal appearance. He is not ill-appearing, toxic-appearing or diaphoretic.  HENT:     Head: Normocephalic and atraumatic.     Right Ear: External ear normal.     Left Ear: External ear normal.     Mouth/Throat:     Mouth: Mucous membranes are moist.     Pharynx: Oropharynx is clear. No oropharyngeal exudate or posterior oropharyngeal erythema.  Eyes:     General: No scleral icterus.       Right eye: No  discharge.        Left eye: No discharge.     Extraocular Movements: Extraocular movements intact.     Conjunctiva/sclera: Conjunctivae normal.     Pupils: Pupils are equal, round, and reactive to light.  Cardiovascular:     Rate and Rhythm: Normal rate and regular rhythm.  Pulmonary:     Effort: Pulmonary effort is normal. No respiratory distress.     Breath sounds: Normal breath sounds. Decreased air movement present. No wheezing or rales.  Abdominal:     General: Bowel sounds are normal.     Tenderness: There is no abdominal tenderness. There is no guarding.  Musculoskeletal:     Cervical back: No rigidity or tenderness.  Skin:    General: Skin is warm and dry.  Neurological:     Mental Status: He is alert and oriented to person, place, and time.  Psychiatric:        Mood and Affect: Mood normal.        Behavior: Behavior normal.      No results found for any visits on 12/01/24.    The 10-year ASCVD risk score (Arnett DK, et al., 2019) is: 23.8%    Assessment & Plan:  Essential hypertension  Cigarette smoker -     Ambulatory Referral for Lung Cancer Scre  Screening for colon cancer -     Ambulatory referral to Gastroenterology  Erectile disorder -     Tadalafil ; Take 1 tablet (5 mg total) by mouth daily.  Dispense: 90 tablet; Refill: 0    Return in about 8 weeks (around 01/26/2025), or Bring BP cuff with you., for annual physical, chronic disease follow-up.  Continue current medications.  Bring BP cuff for physical.  Information was given on managing hypertension.  Discussed Chantix.  Patient is not motivated to quit at this time.  Information was given on quitting tobacco.  Elsie Sim Lent, MD    [1]  Current Outpatient Medications:    amLODipine  (NORVASC ) 5 MG tablet, TAKE 1 TABLET BY MOUTH DAILY, Disp: 90 tablet, Rfl: 1   aspirin EC 81 MG tablet, Take 81 mg by mouth daily., Disp: , Rfl:    atorvastatin  (LIPITOR) 10 MG tablet, Take 1 tablet (10 mg  total) by mouth daily., Disp: 90 tablet, Rfl: 3   irbesartan  (AVAPRO ) 75 MG tablet, Take 1 tablet (75 mg total) by mouth daily., Disp: 90 tablet, Rfl: 1   tamsulosin  (FLOMAX ) 0.4 MG CAPS capsule, TAKE 1 CAPSULE BY MOUTH DAILY, Disp: 90 capsule, Rfl: 0   tadalafil  (CIALIS ) 5 MG tablet, Take 1 tablet (5 mg total) by mouth daily., Disp: 90 tablet, Rfl: 0  "

## 2024-12-21 ENCOUNTER — Other Ambulatory Visit: Payer: Self-pay | Admitting: Family Medicine

## 2024-12-21 DIAGNOSIS — N401 Enlarged prostate with lower urinary tract symptoms: Secondary | ICD-10-CM

## 2025-01-26 ENCOUNTER — Encounter: Admitting: Family Medicine
